# Patient Record
Sex: Female | Born: 1995 | Race: White | Hispanic: No | Marital: Single | Smoking: Never smoker
Health system: Southern US, Community
[De-identification: ages and names within clinical notes are randomized; demographics above are authoritative.]

## PROBLEM LIST (undated history)

## (undated) DIAGNOSIS — D649 Anemia, unspecified: Secondary | ICD-10-CM

## (undated) DIAGNOSIS — J189 Pneumonia, unspecified organism: Secondary | ICD-10-CM

## (undated) DIAGNOSIS — M5431 Sciatica, right side: Secondary | ICD-10-CM

## (undated) DIAGNOSIS — K5190 Ulcerative colitis, unspecified, without complications: Secondary | ICD-10-CM

## (undated) DIAGNOSIS — M1990 Unspecified osteoarthritis, unspecified site: Secondary | ICD-10-CM

## (undated) DIAGNOSIS — K5090 Crohn's disease, unspecified, without complications: Secondary | ICD-10-CM

## (undated) DIAGNOSIS — F411 Generalized anxiety disorder: Secondary | ICD-10-CM

## (undated) DIAGNOSIS — F329 Major depressive disorder, single episode, unspecified: Secondary | ICD-10-CM

## (undated) HISTORY — PX: COLONOSCOPY: SHX174

## (undated) HISTORY — PX: ESOPHAGOGASTRODUODENOSCOPY: SHX1529

## (undated) HISTORY — PX: TONSILLECTOMY: SUR1361

---
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
## 2018-12-25 ENCOUNTER — Encounter: Payer: Self-pay | Admitting: Physician Assistant

## 2018-12-25 ENCOUNTER — Ambulatory Visit (INDEPENDENT_AMBULATORY_CARE_PROVIDER_SITE_OTHER): Payer: 59 | Admitting: Physician Assistant

## 2018-12-25 ENCOUNTER — Ambulatory Visit (INDEPENDENT_AMBULATORY_CARE_PROVIDER_SITE_OTHER): Payer: 59

## 2018-12-25 DIAGNOSIS — M25552 Pain in left hip: Secondary | ICD-10-CM

## 2018-12-25 DIAGNOSIS — M1611 Unilateral primary osteoarthritis, right hip: Secondary | ICD-10-CM

## 2018-12-25 DIAGNOSIS — M87052 Idiopathic aseptic necrosis of left femur: Secondary | ICD-10-CM | POA: Diagnosis not present

## 2018-12-25 NOTE — Progress Notes (Signed)
Office Visit Note   Patient: Daisy Ortiz           Date of Birth: Jun 28, 1995           MRN: [id number] Visit Date: 12/25/2018              Requested by: Nyoka Cowden, MD [address] [place],  [place] [address] PCP: Nyoka Cowden, MD   Assessment & Plan: Visit Diagnoses:  1. Pain in left hip   2. Primary osteoarthritis of right hip   3. Avascular necrosis of bone of hip, left (HCC)     Plan: After reviewing the plain films with patient and her mother and the fact that it is greatly affecting her daily activities recommend total hip replacement.  However due to her history of anemia would not recommend bilateral hip replacements once.  Therefore we will start with the most painful hip which is her right hip.  Again she is failed conservative treatment which is included modification of activities and multiple steroid injections in both hips.  Risk benefits reviewed with the patient at length these include but are not limited to DVT/PE, wound healing problems, infection, blood loss, prolonged pain nerve or vessel injury.  See patient back 2 weeks postop.  Questions encouraged and answered at length by Dr. Magnus Ivan and myself.  Handout on total hip replacement was given hip arthroplasty components shown the patient and her mother today.  Follow-Up Instructions: Return for 2 weeks post-op.   Orders:  Orders Placed This Encounter  Procedures  . XR HIPS BILAT W OR W/O PELVIS 2V   No orders of the defined types were placed in this encounter.     Procedures: No procedures performed   Clinical Data: No additional findings.   Subjective: Chief Complaint  Patient presents with  . Left Hip - Pain  . Right Hip - Pain    HPI Daisy Ortiz is a 23 year old female who comes in today with bilateral hip pain.  States she has had hip pain for years.  She been on steroids multiple times due to her Crohn's disease.  She is seeing a another orthopedic office here in town and they  told her she needed bilateral hip replacements.  She is having difficulty going up and down stairs and walking.  She states she has pain with sitting.  She has problems donning shoes and socks and stopped wearing socks due to the fact that she cannot put a sock on her right foot easily.  States she even has trouble getting dressed on her own due to bilateral hip pain.  She is had multiple intra-articular injections in both hips they are getting progressively less effective.  Review of Systems Negative for fevers or chills.  Objective: Vital Signs: There were no vitals taken for this visit.  Physical Exam Constitutional:      Appearance: She is normal weight. She is not ill-appearing or diaphoretic.  Cardiovascular:     Pulses: Normal pulses.  Pulmonary:     Effort: Pulmonary effort is normal.  Neurological:     Mental Status: She is alert and oriented to person, place, and time.  Psychiatric:        Mood and Affect: Mood normal.     Ortho Exam Bilateral hips she has slightly limited rotation of the left hip with minimal pain.  No internal or external rotation of the right hip pain with any attempts..  Calves are supple nontender bilaterally.  Ambulates  without an assistive device with an antalgic gait  Specialty Comments:  No specialty comments available.  Imaging: Xr Hips Bilat W Or W/o Pelvis 2v  Result Date: 12/25/2018 AP pelvis and bilateral hips: End-stage arthritis right hip.  Osteophytes about the left femoral head with changes consistent with avascular necrosis.  Also narrowing of the left hip joint.  Both hips are well located.  No acute fractures.    PMFS History: There are no active problems to display for this patient.  Past Medical History:  Diagnosis Date  . Crohn's disease ([place])   . Ulcerative colitis ([place])     History reviewed. No pertinent family history.  History reviewed. No pertinent surgical history. Social History   Occupational History  . Not on  file  Tobacco Use  . Smoking status: Never Smoker  Substance and Sexual Activity  . Alcohol use: Not on file  . Drug use: Not on file  . Sexual activity: Not on file

## 2019-01-15 ENCOUNTER — Other Ambulatory Visit: Payer: Self-pay | Admitting: Physician Assistant

## 2019-01-22 ENCOUNTER — Other Ambulatory Visit: Payer: Self-pay

## 2019-01-26 NOTE — Patient Instructions (Addendum)
DUE TO COVID-19 ONLY ONE VISITOR IS ALLOWED TO COME WITH YOU AND STAY IN THE WAITING ROOM ONLY DURING PRE OP AND PROCEDURE DAY OF SURGERY. THE 1 VISITOR MAY VISIT WITH YOU AFTER SURGERY IN YOUR PRIVATE ROOM DURING VISITING HOURS ONLY!  YOU NEED TO HAVE A COVID 19 TEST ON_______ @_______ , THIS TEST MUST BE DONE BEFORE SURGERY, COME  [address], [place] , [phone].  [facility]) ONCE YOUR COVID TEST IS COMPLETED, PLEASE BEGIN THE QUARANTINE INSTRUCTIONS AS OUTLINED IN YOUR HANDOUT.                Daisy Ortiz  01/26/2019   Your procedure is scheduled on: 02-02-19   [facility]   Report to admitting at        0945  AM     Call this number if you have problems the morning of surgery [phone]    Remember: NO SOLID FOOD AFTER MIDNIGHT THE NIGHT PRIOR TO SURGERY. NOTHING BY MOUTH EXCEPT CLEAR LIQUIDS UNTIL 0915 am . PLEASE FINISH ENSURE DRINK PER SURGEON ORDER  WHICH NEEDS TO BE COMPLETED AT      0915 am then nothing by mouth .    CLEAR LIQUID DIET   Foods Allowed                                                                     Foods Excluded  Coffee and tea, regular and decaf                             liquids that you cannot  Plain Jell-O any favor except red or purple                                           see through such as: Fruit ices (not with fruit pulp)                                     milk, soups, orange juice  Iced Popsicles                                    All solid food Carbonated beverages, regular and diet                                    Cranberry, grape and apple juices Sports drinks like Gatorade Lightly seasoned clear broth or consume(fat free) Sugar, honey syrup   _____________________________________________________________________     BRUSH YOUR TEETH MORNING OF SURGERY AND RINSE YOUR MOUTH OUT, NO CHEWING GUM CANDY OR MINTS.     Take these medicines the morning of surgery with A  SIP OF WATER:  Valium if needed  You may not have any metal on your body including hair pins and              piercings  Do not wear jewelry, make-up, lotions, powders or perfumes, deodorant             Do not wear nail polish.  Do not shave  48 hours prior to surgery.              Do not bring valuables to the hospital. [place].  Contacts, dentures or bridgework may not be worn into surgery.               Please read over the following fact sheets you were given: ____________________________________________________________________           [facility] - Preparing for Surgery Before surgery, you can play an important role.  Because skin is not sterile, your skin needs to be as free of germs as possible.  You can reduce the number of germs on your skin by washing with CHG (chlorahexidine gluconate) soap before surgery.  CHG is an antiseptic cleaner which kills germs and bonds with the skin to continue killing germs even after washing. Please DO NOT use if you have an allergy to CHG or antibacterial soaps.  If your skin becomes reddened/irritated stop using the CHG and inform your nurse when you arrive at Short Stay. Do not shave (including legs and underarms) for at least 48 hours prior to the first CHG shower.  You may shave your face/neck. Please follow these instructions carefully:  1.  Shower with CHG Soap the night before surgery and the  morning of Surgery.  2.  If you choose to wash your hair, wash your hair first as usual with your  normal  shampoo.  3.  After you shampoo, rinse your hair and body thoroughly to remove the  shampoo.                           4.  Use CHG as you would any other liquid soap.  You can apply chg directly  to the skin and wash                       Gently with a scrungie or clean washcloth.  5.  Apply the CHG Soap to your body ONLY FROM THE NECK DOWN.   Do not use on face/  open                           Wound or open sores. Avoid contact with eyes, ears mouth and genitals (private parts).                       Wash face,  Genitals (private parts) with your normal soap.             6.  Wash thoroughly, paying special attention to the area where your surgery  will be performed.  7.  Thoroughly rinse your body with warm water from the neck down.  8.  DO NOT shower/wash with your normal soap after using and rinsing off  the CHG Soap.                9.  Pat yourself dry with  a clean towel.            10.  Wear clean pajamas.            11.  Place clean sheets on your bed the night of your first shower and do not  sleep with pets. Day of Surgery : Do not apply any lotions/deodorants the morning of surgery.  Please wear clean clothes to the hospital/surgery center.  FAILURE TO FOLLOW THESE INSTRUCTIONS MAY RESULT IN THE CANCELLATION OF YOUR SURGERY PATIENT SIGNATURE_________________________________  NURSE SIGNATURE__________________________________  ________________________________________________________________________   Daisy Ortiz  An incentive spirometer is a tool that can help keep your lungs clear and active. This tool measures how well you are filling your lungs with each breath. Taking long deep breaths may help reverse or decrease the chance of developing breathing (pulmonary) problems (especially infection) following:  A long period of time when you are unable to move or be active. BEFORE THE PROCEDURE   If the spirometer includes an indicator to show your best effort, your nurse or respiratory therapist will set it to a desired goal.  If possible, sit up straight or lean slightly forward. Try not to slouch.  Hold the incentive spirometer in an upright position. INSTRUCTIONS FOR USE  1. Sit on the edge of your bed if possible, or sit up as far as you can in bed or on a chair. 2. Hold the incentive spirometer in an upright  position. 3. Breathe out normally. 4. Place the mouthpiece in your mouth and seal your lips tightly around it. 5. Breathe in slowly and as deeply as possible, raising the piston or the ball toward the top of the column. 6. Hold your breath for 3-5 seconds or for as long as possible. Allow the piston or ball to fall to the bottom of the column. 7. Remove the mouthpiece from your mouth and breathe out normally. 8. Rest for a few seconds and repeat Steps 1 through 7 at least 10 times every 1-2 hours when you are awake. Take your time and take a few normal breaths between deep breaths. 9. The spirometer may include an indicator to show your best effort. Use the indicator as a goal to work toward during each repetition. 10. After each set of 10 deep breaths, practice coughing to be sure your lungs are clear. If you have an incision (the cut made at the time of surgery), support your incision when coughing by placing a pillow or rolled up towels firmly against it. Once you are able to get out of bed, walk around indoors and cough well. You may stop using the incentive spirometer when instructed by your caregiver.  RISKS AND COMPLICATIONS  Take your time so you do not get dizzy or light-headed.  If you are in pain, you may need to take or ask for pain medication before doing incentive spirometry. It is harder to take a deep breath if you are having pain. AFTER USE  Rest and breathe slowly and easily.  It can be helpful to keep track of a log of your progress. Your caregiver can provide you with a simple table to help with this. If you are using the spirometer at home, follow these instructions: SEEK MEDICAL CARE IF:   You are having difficultly using the spirometer.  You have trouble using the spirometer as often as instructed.  Your pain medication is not giving enough relief while using the spirometer.  You develop fever of 100.5 F (38.1 C)  or higher. SEEK IMMEDIATE MEDICAL CARE IF:    You cough up bloody sputum that had not been present before.  You develop fever of 102 F (38.9 C) or greater.  You develop worsening pain at or near the incision site. MAKE SURE YOU:   Understand these instructions.  Will watch your condition.  Will get help right away if you are not doing well or get worse. Document Released: 09/27/2006 Document Revised: 08/09/2011 Document Reviewed: 11/28/2006 [facility] Patient Information 2014 [employer].   ________________________________________________________________________

## 2019-01-30 ENCOUNTER — Encounter (HOSPITAL_COMMUNITY): Payer: Self-pay

## 2019-01-30 ENCOUNTER — Other Ambulatory Visit: Payer: Self-pay

## 2019-01-30 ENCOUNTER — Encounter (HOSPITAL_COMMUNITY)
Admission: RE | Admit: 2019-01-30 | Discharge: 2019-01-30 | Disposition: A | Discharge status: Home / Self Care | Payer: 59 | Source: Ambulatory Visit | Attending: Orthopaedic Surgery | Admitting: Orthopaedic Surgery

## 2019-01-30 ENCOUNTER — Other Ambulatory Visit (HOSPITAL_COMMUNITY)
Admission: RE | Admit: 2019-01-30 | Discharge: 2019-01-30 | Disposition: A | Discharge status: Home / Self Care | Payer: 59 | Source: Ambulatory Visit | Attending: Orthopaedic Surgery | Admitting: Orthopaedic Surgery

## 2019-01-30 HISTORY — DX: Anemia, unspecified: D64.9

## 2019-01-30 HISTORY — DX: Unspecified osteoarthritis, unspecified site: M19.90

## 2019-01-30 HISTORY — DX: Pneumonia, unspecified organism: J18.9

## 2019-01-30 LAB — CBC
HCT: 39 % (ref 36.0–46.0)
Hemoglobin: 12.2 g/dL (ref 12.0–15.0)
MCH: 26.6 pg (ref 26.0–34.0)
MCHC: 31.3 g/dL (ref 30.0–36.0)
MCV: 85.2 fL (ref 80.0–100.0)
Platelets: 292 10*3/uL (ref 150–400)
RBC: 4.58 MIL/uL (ref 3.87–5.11)
RDW: 14.2 % (ref 11.5–15.5)
WBC: 9.8 10*3/uL (ref 4.0–10.5)
nRBC: 0 % (ref 0.0–0.2)

## 2019-01-30 LAB — SARS CORONAVIRUS 2 (TAT 6-24 HRS): SARS Coronavirus 2: NEGATIVE

## 2019-01-30 NOTE — Progress Notes (Signed)
PCP - Helene Kelp Cardiologist - none  Chest x-ray -  EKG -  Stress Test -  ECHO -  Cardiac Cath -   Sleep Study -  CPAP -   Fasting Blood Sugar -  Checks Blood Sugar _____ times a day  Blood Thinner Instructions: Aspirin Instructions:  Anesthesia review:   Patient denies shortness of breath, fever, cough and chest pain at PAT appointment       Patient verbalized understanding of instructions that were given to them at the PAT appointment. Patient was also instructed that they will need to review over the PAT instructions again at home before surgery.

## 2019-02-01 LAB — NASOPHARYNGEAL CULTURE: Culture: NORMAL

## 2019-02-02 ENCOUNTER — Inpatient Hospital Stay (HOSPITAL_COMMUNITY): Payer: 59

## 2019-02-02 ENCOUNTER — Inpatient Hospital Stay (HOSPITAL_COMMUNITY): Payer: 59 | Admitting: Physician Assistant

## 2019-02-02 ENCOUNTER — Encounter (HOSPITAL_COMMUNITY)
Admission: RE | Disposition: A | Discharge status: Home Health | Payer: Self-pay | Source: Home / Self Care | Attending: Orthopaedic Surgery

## 2019-02-02 ENCOUNTER — Inpatient Hospital Stay (HOSPITAL_COMMUNITY)
Admission: RE | Admit: 2019-02-02 | Discharge: 2019-02-04 | DRG: 470 | Disposition: A | Discharge status: Home Health | Payer: 59 | Attending: Orthopaedic Surgery | Admitting: Orthopaedic Surgery

## 2019-02-02 ENCOUNTER — Encounter (HOSPITAL_COMMUNITY): Payer: Self-pay | Admitting: *Deleted

## 2019-02-02 ENCOUNTER — Other Ambulatory Visit: Payer: Self-pay

## 2019-02-02 ENCOUNTER — Inpatient Hospital Stay (HOSPITAL_COMMUNITY): Payer: 59 | Admitting: Certified Registered Nurse Anesthetist

## 2019-02-02 DIAGNOSIS — M879 Osteonecrosis, unspecified: Secondary | ICD-10-CM | POA: Diagnosis present

## 2019-02-02 DIAGNOSIS — Z419 Encounter for procedure for purposes other than remedying health state, unspecified: Secondary | ICD-10-CM

## 2019-02-02 DIAGNOSIS — M1611 Unilateral primary osteoarthritis, right hip: Principal | ICD-10-CM

## 2019-02-02 DIAGNOSIS — Z20828 Contact with and (suspected) exposure to other viral communicable diseases: Secondary | ICD-10-CM | POA: Diagnosis present

## 2019-02-02 DIAGNOSIS — Z888 Allergy status to other drugs, medicaments and biological substances status: Secondary | ICD-10-CM

## 2019-02-02 DIAGNOSIS — Z96641 Presence of right artificial hip joint: Secondary | ICD-10-CM

## 2019-02-02 DIAGNOSIS — Z79899 Other long term (current) drug therapy: Secondary | ICD-10-CM

## 2019-02-02 HISTORY — PX: TOTAL HIP ARTHROPLASTY: SHX124

## 2019-02-02 LAB — HCG, SERUM, QUALITATIVE: Preg, Serum: NEGATIVE

## 2019-02-02 SURGERY — ARTHROPLASTY, HIP, TOTAL, ANTERIOR APPROACH
Anesthesia: Spinal | Laterality: Right

## 2019-02-02 MED ORDER — METHOCARBAMOL 500 MG PO TABS
500.0000 mg | ORAL_TABLET | Freq: Four times a day (QID) | ORAL | Status: DC | PRN
  Administered 2019-02-02 – 2019-02-04 (×5): 500 mg via ORAL
  Filled 2019-02-02 (×5): qty 1

## 2019-02-02 MED ORDER — METHOCARBAMOL 500 MG IVPB - SIMPLE MED
500.0000 mg | Freq: Four times a day (QID) | INTRAVENOUS | Status: DC | PRN
  Administered 2019-02-02: 500 mg via INTRAVENOUS
  Filled 2019-02-02: qty 50

## 2019-02-02 MED ORDER — ONDANSETRON HCL 4 MG PO TABS: 4.0000 mg | ORAL_TABLET | Freq: Four times a day (QID) | ORAL | Status: DC | PRN

## 2019-02-02 MED ORDER — MENTHOL 3 MG MT LOZG: 1.0000 | LOZENGE | OROMUCOSAL | Status: DC | PRN

## 2019-02-02 MED ORDER — POLYETHYLENE GLYCOL 3350 17 G PO PACK: 17.0000 g | PACK | Freq: Every day | ORAL | Status: DC | PRN

## 2019-02-02 MED ORDER — ONDANSETRON HCL 4 MG/2ML IJ SOLN
INTRAMUSCULAR | Status: DC | PRN
  Administered 2019-02-02: 4 mg via INTRAVENOUS

## 2019-02-02 MED ORDER — ASPIRIN 81 MG PO CHEW
81.0000 mg | CHEWABLE_TABLET | Freq: Two times a day (BID) | ORAL | Status: DC
  Administered 2019-02-02 – 2019-02-04 (×4): 81 mg via ORAL
  Filled 2019-02-02 (×4): qty 1

## 2019-02-02 MED ORDER — ONDANSETRON HCL 4 MG/2ML IJ SOLN: 4.0000 mg | Freq: Four times a day (QID) | INTRAMUSCULAR | Status: DC | PRN

## 2019-02-02 MED ORDER — ONDANSETRON HCL 4 MG/2ML IJ SOLN
INTRAMUSCULAR | Status: AC
  Filled 2019-02-02: qty 2

## 2019-02-02 MED ORDER — FERROUS SULFATE 325 (65 FE) MG PO TABS
ORAL_TABLET | Freq: Every day | ORAL | Status: DC
  Administered 2019-02-02 – 2019-02-04 (×3): 325 mg via ORAL
  Filled 2019-02-02 (×3): qty 1

## 2019-02-02 MED ORDER — MIDAZOLAM HCL 2 MG/2ML IJ SOLN
INTRAMUSCULAR | Status: AC
  Filled 2019-02-02: qty 2

## 2019-02-02 MED ORDER — PANTOPRAZOLE SODIUM 40 MG PO TBEC
40.0000 mg | DELAYED_RELEASE_TABLET | Freq: Every day | ORAL | Status: DC
  Administered 2019-02-03 – 2019-02-04 (×2): 40 mg via ORAL
  Filled 2019-02-02 (×3): qty 1

## 2019-02-02 MED ORDER — KETOROLAC TROMETHAMINE 15 MG/ML IJ SOLN
7.5000 mg | Freq: Four times a day (QID) | INTRAMUSCULAR | Status: DC
  Administered 2019-02-02 – 2019-02-04 (×7): 7.5 mg via INTRAVENOUS
  Filled 2019-02-02 (×8): qty 1

## 2019-02-02 MED ORDER — ESCITALOPRAM OXALATE 10 MG PO TABS
10.0000 mg | ORAL_TABLET | Freq: Every evening | ORAL | Status: DC
  Administered 2019-02-02 – 2019-02-03 (×2): 10 mg via ORAL
  Filled 2019-02-02 (×2): qty 1

## 2019-02-02 MED ORDER — METHOCARBAMOL 500 MG IVPB - SIMPLE MED
INTRAVENOUS | Status: AC
  Filled 2019-02-02: qty 50

## 2019-02-02 MED ORDER — ACETAMINOPHEN 325 MG PO TABS
325.0000 mg | ORAL_TABLET | Freq: Four times a day (QID) | ORAL | Status: DC | PRN
  Administered 2019-02-04: 650 mg via ORAL
  Filled 2019-02-02: qty 2

## 2019-02-02 MED ORDER — CEFAZOLIN SODIUM-DEXTROSE 1-4 GM/50ML-% IV SOLN
1.0000 g | Freq: Four times a day (QID) | INTRAVENOUS | Status: AC
  Administered 2019-02-02 – 2019-02-03 (×2): 1 g via INTRAVENOUS
  Filled 2019-02-02 (×2): qty 50

## 2019-02-02 MED ORDER — PROPOFOL 10 MG/ML IV BOLUS
INTRAVENOUS | Status: DC | PRN
  Administered 2019-02-02: 30 mg via INTRAVENOUS
  Administered 2019-02-02 (×3): 20 mg via INTRAVENOUS
  Administered 2019-02-02: 10 mg via INTRAVENOUS

## 2019-02-02 MED ORDER — PHENOL 1.4 % MT LIQD: 1.0000 | OROMUCOSAL | Status: DC | PRN

## 2019-02-02 MED ORDER — METOCLOPRAMIDE HCL 5 MG/ML IJ SOLN: 5.0000 mg | Freq: Three times a day (TID) | INTRAMUSCULAR | Status: DC | PRN

## 2019-02-02 MED ORDER — LACTATED RINGERS IV SOLN: INTRAVENOUS | Status: DC

## 2019-02-02 MED ORDER — SODIUM CHLORIDE 0.9 % IV SOLN
INTRAVENOUS | Status: DC
  Administered 2019-02-02: 23:00:00 via INTRAVENOUS

## 2019-02-02 MED ORDER — FENTANYL CITRATE (PF) 100 MCG/2ML IJ SOLN
25.0000 ug | INTRAMUSCULAR | Status: DC | PRN
  Administered 2019-02-02 (×3): 50 ug via INTRAVENOUS

## 2019-02-02 MED ORDER — DIPHENHYDRAMINE HCL 12.5 MG/5ML PO ELIX: 12.5000 mg | ORAL_SOLUTION | ORAL | Status: DC | PRN

## 2019-02-02 MED ORDER — EPHEDRINE SULFATE-NACL 50-0.9 MG/10ML-% IV SOSY
PREFILLED_SYRINGE | INTRAVENOUS | Status: DC | PRN
  Administered 2019-02-02: 5 mg via INTRAVENOUS
  Administered 2019-02-02: 10 mg via INTRAVENOUS

## 2019-02-02 MED ORDER — ALUM & MAG HYDROXIDE-SIMETH 200-200-20 MG/5ML PO SUSP: 30.0000 mL | ORAL | Status: DC | PRN

## 2019-02-02 MED ORDER — HYDROMORPHONE HCL 1 MG/ML IJ SOLN
INTRAMUSCULAR | Status: AC
  Filled 2019-02-02: qty 1

## 2019-02-02 MED ORDER — MIDAZOLAM HCL 5 MG/5ML IJ SOLN
INTRAMUSCULAR | Status: DC | PRN
  Administered 2019-02-02: 2 mg via INTRAVENOUS

## 2019-02-02 MED ORDER — PROPOFOL 10 MG/ML IV BOLUS
INTRAVENOUS | Status: AC
  Filled 2019-02-02: qty 60

## 2019-02-02 MED ORDER — FENTANYL CITRATE (PF) 100 MCG/2ML IJ SOLN
INTRAMUSCULAR | Status: DC | PRN
  Administered 2019-02-02 (×2): 50 ug via INTRAVENOUS

## 2019-02-02 MED ORDER — OXYCODONE HCL 5 MG PO TABS
ORAL_TABLET | ORAL | Status: AC
  Filled 2019-02-02: qty 1

## 2019-02-02 MED ORDER — CHLORHEXIDINE GLUCONATE 4 % EX LIQD: 60.0000 mL | Freq: Once | CUTANEOUS | Status: DC

## 2019-02-02 MED ORDER — LACTATED RINGERS IV SOLN
INTRAVENOUS | Status: DC
  Administered 2019-02-02 (×3): via INTRAVENOUS

## 2019-02-02 MED ORDER — METOCLOPRAMIDE HCL 5 MG/ML IJ SOLN: 10.0000 mg | Freq: Once | INTRAMUSCULAR | Status: DC | PRN

## 2019-02-02 MED ORDER — OXYCODONE HCL 5 MG PO TABS
5.0000 mg | ORAL_TABLET | ORAL | Status: DC | PRN
  Administered 2019-02-02: 5 mg via ORAL
  Administered 2019-02-02: 10 mg via ORAL
  Administered 2019-02-03 (×2): 5 mg via ORAL
  Administered 2019-02-03: 10 mg via ORAL
  Filled 2019-02-02 (×2): qty 1
  Filled 2019-02-02 (×3): qty 2

## 2019-02-02 MED ORDER — TRANEXAMIC ACID-NACL 1000-0.7 MG/100ML-% IV SOLN
1000.0000 mg | INTRAVENOUS | Status: AC
  Administered 2019-02-02: 1000 mg via INTRAVENOUS

## 2019-02-02 MED ORDER — FENTANYL CITRATE (PF) 100 MCG/2ML IJ SOLN
INTRAMUSCULAR | Status: AC
  Filled 2019-02-02: qty 2

## 2019-02-02 MED ORDER — NORETHIN ACE-ETH ESTRAD-FE 1.5-30 MG-MCG PO TABS: 1.0000 | ORAL_TABLET | Freq: Every evening | ORAL | Status: DC

## 2019-02-02 MED ORDER — BUPIVACAINE IN DEXTROSE 0.75-8.25 % IT SOLN
INTRATHECAL | Status: DC | PRN
  Administered 2019-02-02: 1.8 mL via INTRATHECAL

## 2019-02-02 MED ORDER — HYDROMORPHONE HCL 1 MG/ML IJ SOLN
0.5000 mg | INTRAMUSCULAR | Status: DC | PRN
  Administered 2019-02-02: 1 mg via INTRAVENOUS

## 2019-02-02 MED ORDER — TRANEXAMIC ACID-NACL 1000-0.7 MG/100ML-% IV SOLN
INTRAVENOUS | Status: AC
  Filled 2019-02-02: qty 100

## 2019-02-02 MED ORDER — SODIUM CHLORIDE 0.9 % IR SOLN
Status: DC | PRN
  Administered 2019-02-02 (×2): 1000 mL

## 2019-02-02 MED ORDER — METOCLOPRAMIDE HCL 5 MG PO TABS: 5.0000 mg | ORAL_TABLET | Freq: Three times a day (TID) | ORAL | Status: DC | PRN

## 2019-02-02 MED ORDER — CEFAZOLIN SODIUM-DEXTROSE 2-4 GM/100ML-% IV SOLN
INTRAVENOUS | Status: AC
  Filled 2019-02-02: qty 100

## 2019-02-02 MED ORDER — HYDROMORPHONE HCL 1 MG/ML IJ SOLN
0.5000 mg | INTRAMUSCULAR | Status: DC | PRN
  Administered 2019-02-03: 1 mg via INTRAVENOUS
  Filled 2019-02-02: qty 1

## 2019-02-02 MED ORDER — POVIDONE-IODINE 10 % EX SWAB
2.0000 "application " | Freq: Once | CUTANEOUS | Status: AC
  Administered 2019-02-02: 2 via TOPICAL

## 2019-02-02 MED ORDER — CEFAZOLIN SODIUM-DEXTROSE 2-4 GM/100ML-% IV SOLN
2.0000 g | INTRAVENOUS | Status: AC
  Administered 2019-02-02: 2 g via INTRAVENOUS

## 2019-02-02 MED ORDER — PROPOFOL 500 MG/50ML IV EMUL
INTRAVENOUS | Status: DC | PRN
  Administered 2019-02-02: 75 ug/kg/min via INTRAVENOUS

## 2019-02-02 MED ORDER — NORETHIN ACE-ETH ESTRAD-FE 1.5-30 MG-MCG PO TABS
1.0000 | ORAL_TABLET | Freq: Every evening | ORAL | Status: DC
  Filled 2019-02-02: qty 1

## 2019-02-02 MED ORDER — OXYCODONE HCL 5 MG PO TABS
10.0000 mg | ORAL_TABLET | ORAL | Status: DC | PRN
  Administered 2019-02-02 – 2019-02-03 (×2): 10 mg via ORAL
  Administered 2019-02-04: 15 mg via ORAL
  Filled 2019-02-02 (×2): qty 2
  Filled 2019-02-02: qty 3

## 2019-02-02 MED ORDER — DIAZEPAM 2 MG PO TABS
5.0000 mg | ORAL_TABLET | Freq: Three times a day (TID) | ORAL | Status: DC | PRN
  Administered 2019-02-02: 5 mg via ORAL
  Filled 2019-02-02: qty 3

## 2019-02-02 MED ORDER — MEPERIDINE HCL 50 MG/ML IJ SOLN: 6.2500 mg | INTRAMUSCULAR | Status: DC | PRN

## 2019-02-02 MED ORDER — FENTANYL CITRATE (PF) 100 MCG/2ML IJ SOLN
INTRAMUSCULAR | Status: AC
  Filled 2019-02-02: qty 4

## 2019-02-02 MED ORDER — DOCUSATE SODIUM 100 MG PO CAPS
100.0000 mg | ORAL_CAPSULE | Freq: Two times a day (BID) | ORAL | Status: DC
  Administered 2019-02-03 – 2019-02-04 (×3): 100 mg via ORAL
  Filled 2019-02-02 (×4): qty 1

## 2019-02-02 SURGICAL SUPPLY — 44 items
BAG ZIPLOCK 12X15 (MISCELLANEOUS) IMPLANT
BALL HIP CERAMIC (Hips) IMPLANT
BENZOIN TINCTURE PRP APPL 2/3 (GAUZE/BANDAGES/DRESSINGS) ×2 IMPLANT
BLADE SAW SGTL 18X1.27X75 (BLADE) ×2 IMPLANT
BLADE SAW SGTL 18X1.27X75MM (BLADE) ×1
BLADE SURG SZ10 CARB STEEL (BLADE) ×6 IMPLANT
CLOSURE WOUND 1/2 X4 (GAUZE/BANDAGES/DRESSINGS) ×1
COVER PERINEAL POST (MISCELLANEOUS) ×3 IMPLANT
COVER SURGICAL LIGHT HANDLE (MISCELLANEOUS) ×3 IMPLANT
COVER WAND RF STERILE (DRAPES) IMPLANT
CUP ACET PINNACLE SECTR 48MM (Joint) IMPLANT
DRAPE STERI IOBAN 125X83 (DRAPES) ×3 IMPLANT
DRAPE U-SHAPE 47X51 STRL (DRAPES) ×6 IMPLANT
DRESSING AQUACEL AG SP 3.5X10 (GAUZE/BANDAGES/DRESSINGS) IMPLANT
DRSG AQUACEL AG ADV 3.5X10 (GAUZE/BANDAGES/DRESSINGS) ×3 IMPLANT
DRSG AQUACEL AG SP 3.5X10 (GAUZE/BANDAGES/DRESSINGS) ×3
DURAPREP 26ML APPLICATOR (WOUND CARE) ×3 IMPLANT
ELECT REM PT RETURN 15FT ADLT (MISCELLANEOUS) ×3 IMPLANT
GAUZE XEROFORM 1X8 LF (GAUZE/BANDAGES/DRESSINGS) ×3 IMPLANT
GLOVE BIO SURGEON STRL SZ7.5 (GLOVE) ×3 IMPLANT
GLOVE BIOGEL PI IND STRL 8 (GLOVE) ×2 IMPLANT
GLOVE BIOGEL PI INDICATOR 8 (GLOVE) ×4
GLOVE ECLIPSE 8.0 STRL XLNG CF (GLOVE) ×3 IMPLANT
GOWN STRL REUS W/TWL XL LVL3 (GOWN DISPOSABLE) ×6 IMPLANT
HANDPIECE INTERPULSE COAX TIP (DISPOSABLE) ×2
HIP BALL CERAMIC (Hips) ×3 IMPLANT
HOLDER FOLEY CATH W/STRAP (MISCELLANEOUS) ×3 IMPLANT
KIT TURNOVER KIT A (KITS) IMPLANT
PACK ANTERIOR HIP CUSTOM (KITS) ×3 IMPLANT
PINN ALTRX NEUT ID X OD 32X48 ×2 IMPLANT
PINNSECTOR W/GRIP ACE CUP 48MM (Joint) ×3 IMPLANT
SET HNDPC FAN SPRY TIP SCT (DISPOSABLE) ×1 IMPLANT
STAPLER VISISTAT 35W (STAPLE) IMPLANT
STEM FEM SZ3 STD ACTIS (Stem) ×2 IMPLANT
STRIP CLOSURE SKIN 1/2X4 (GAUZE/BANDAGES/DRESSINGS) ×1 IMPLANT
SUT ETHIBOND NAB CT1 #1 30IN (SUTURE) ×3 IMPLANT
SUT ETHILON 2 0 PS N (SUTURE) IMPLANT
SUT MNCRL AB 4-0 PS2 18 (SUTURE) IMPLANT
SUT VIC AB 0 CT1 36 (SUTURE) ×3 IMPLANT
SUT VIC AB 1 CT1 36 (SUTURE) ×3 IMPLANT
SUT VIC AB 2-0 CT1 27 (SUTURE) ×4
SUT VIC AB 2-0 CT1 TAPERPNT 27 (SUTURE) ×2 IMPLANT
TRAY FOLEY MTR SLVR 16FR STAT (SET/KITS/TRAYS/PACK) ×3 IMPLANT
YANKAUER SUCT BULB TIP 10FT TU (MISCELLANEOUS) ×3 IMPLANT

## 2019-02-02 NOTE — Anesthesia Preprocedure Evaluation (Signed)
Anesthesia Evaluation  Patient identified by MRN, date of birth, ID band Patient awake    Reviewed: Allergy & Precautions, NPO status , Patient's Chart, lab work & pertinent test results  Airway Mallampati: II  TM Distance: >3 FB Neck ROM: Full    Dental no notable dental hx.    Pulmonary neg pulmonary ROS,    Pulmonary exam normal breath sounds clear to auscultation       Cardiovascular negative cardio ROS Normal cardiovascular exam Rhythm:Regular Rate:Normal     Neuro/Psych negative neurological ROS  negative psych ROS   GI/Hepatic negative GI ROS, Neg liver ROS,   Endo/Other  negative endocrine ROS  Renal/GU negative Renal ROS  negative genitourinary   Musculoskeletal negative musculoskeletal ROS (+)   Abdominal   Peds negative pediatric ROS (+)  Hematology negative hematology ROS (+)   Anesthesia Other Findings   Reproductive/Obstetrics negative OB ROS                             Anesthesia Physical Anesthesia Plan  ASA: I  Anesthesia Plan: Spinal   Post-op Pain Management:    Induction:   PONV Risk Score and Plan: 2 and Treatment may vary due to age or medical condition  Airway Management Planned: Nasal Cannula and Simple Face Mask  Additional Equipment:   Intra-op Plan:   Post-operative Plan:   Informed Consent: I have reviewed the patients History and Physical, chart, labs and discussed the procedure including the risks, benefits and alternatives for the proposed anesthesia with the patient or authorized representative who has indicated his/her understanding and acceptance.     Dental advisory given  Plan Discussed with: CRNA  Anesthesia Plan Comments:         Anesthesia Quick Evaluation

## 2019-02-02 NOTE — H&P (Signed)
TOTAL HIP ADMISSION H&P  Patient is admitted for right total hip arthroplasty.  Subjective:  Chief Complaint: right hip pain  HPI: Daisy Ortiz, 23 y.o. female, has a history of pain and functional disability in the right hip(s) due to arthritis and patient has failed non-surgical conservative treatments for greater than 12 weeks to include NSAID's and/or analgesics, corticosteriod injections, flexibility and strengthening excercises, supervised PT with diminished ADL's post treatment, use of assistive devices and activity modification.  Onset of symptoms was gradual starting 2 years ago with gradually worsening course since that time.The patient noted no past surgery on the right hip(s).  Patient currently rates pain in the right hip at 10 out of 10 with activity. Patient has night pain, worsening of pain with activity and weight bearing, trendelenberg gait, pain that interfers with activities of daily living, pain with passive range of motion and crepitus. Patient has evidence of subchondral cysts, subchondral sclerosis, periarticular osteophytes and joint space narrowing by imaging studies. This condition presents safety issues increasing the risk of falls.  There is no current active infection.  Patient Active Problem List   Diagnosis Date Noted  . Unilateral primary osteoarthritis, right hip 02/02/2019   Past Medical History:  Diagnosis Date  . Anemia   . 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 Crohn's disease (HCC)   . Pneumonia    as a child  . Ulcerative colitis [facility])     Past Surgical History:  Procedure Laterality Date  . COLONOSCOPY    . ESOPHAGOGASTRODUODENOSCOPY    . TONSILLECTOMY      No current facility-administered medications for this encounter.    Current Outpatient Medications  Medication Sig Dispense Refill Last Dose  . acetaminophen (TYLENOL) 500 MG tablet Take 500 mg by mouth 3 (three) times daily as needed for moderate pain or headache.     . diazepam (VALIUM) 5 MG  tablet Take 5 mg by mouth every 8 (eight) hours as needed for muscle spasms or anxiety.     . ENTYVIO 300 MG injection Inject 300 mg as directed every 14 (fourteen) days.     Marland Kitchen escitalopram (LEXAPRO) 10 MG tablet Take 10 mg by mouth every evening.     . Ferrous Sulfate (IRON PO) Take 1 tablet by mouth daily.     Colleen Can FE 1.5/30 1.5-30 MG-MCG tablet Take 1 tablet by mouth every evening.      Allergies  Allergen Reactions  . Infliximab Anaphylaxis, Shortness Of Breath, Nausea And Vomiting and Swelling    Facial Edema (intolerance)    Social History   Tobacco Use  . Smoking status: Never Smoker  . Smokeless tobacco: Never Used  Substance Use Topics  . Alcohol use: Never    Frequency: Never    No family history on file.   Review of Systems  Musculoskeletal: Positive for joint pain.  All other systems reviewed and are negative.   Objective:  Physical Exam  Constitutional: She is oriented to person, place, and time. She appears well-developed and well-nourished.  HENT:  Head: Normocephalic and atraumatic.  Eyes: Pupils are equal, round, and reactive to light. EOM are normal.  Neck: Normal range of motion. Neck supple.  Cardiovascular: Normal rate.  Respiratory: Effort normal.  GI: Soft.  Musculoskeletal:     Right hip: She exhibits decreased range of motion, decreased strength, tenderness and bony tenderness.  Neurological: She is alert and oriented to person, place, and time.  Skin: Skin is warm and  dry.  Psychiatric: She has a normal mood and affect.    Vital signs in last 24 hours:    Labs:   Estimated body mass index is 19.73 kg/m as calculated from the following:   Height as of 01/30/19: 5' 5.5" (1.664 m).   Weight as of 01/30/19: 54.6 kg.   Imaging Review Plain radiographs demonstrate severe degenerative joint disease of the right hip(s). The bone quality appears to be excellent for age and reported activity level.      Assessment/Plan:  End stage  arthritis, right hip(s)  The patient history, physical examination, clinical judgement of the provider and imaging studies are consistent with end stage degenerative joint disease of the right hip(s) and total hip arthroplasty is deemed medically necessary. The treatment options including medical management, injection therapy, arthroscopy and arthroplasty were discussed at length. The risks and benefits of total hip arthroplasty were presented and reviewed. The risks due to aseptic loosening, infection, stiffness, dislocation/subluxation,  thromboembolic complications and other imponderables were discussed.  The patient acknowledged the explanation, agreed to proceed with the plan and consent was signed. Patient is being admitted for inpatient treatment for surgery, pain control, PT, OT, prophylactic antibiotics, VTE prophylaxis, progressive ambulation and ADL's and discharge planning.The patient is planning to be discharged home with home health services

## 2019-02-02 NOTE — Op Note (Signed)
NAME: Daisy Ortiz, Daisy E. MEDICAL RECORD [id number] ACCOUNT [id number] DATE OF BIRTH:July 30, 1995 FACILITY: [facility] LOCATION: [facility] PHYSICIAN:Tan Clopper Aretha ParrotY. Yosef Krogh, MD  OPERATIVE REPORT  DATE OF PROCEDURE:  02/02/2019  PREOPERATIVE DIAGNOSIS:  Avascular necrosis, right hip.  POSTOPERATIVE DIAGNOSIS:  Avascular necrosis, right hip.  PROCEDURE:  Right total hip arthroplasty through direct anterior approach.  IMPLANTS:  DePuy Sector Gription acetabular component size 48, size 32+4 neutral polyethylene liner, size 3 Actis femoral component with standard offset, size 32+5 ceramic hip ball.  SURGEON:  Vanita PandaChristopher Y. Magnus IvanBlackman, MD  ASSISTANT:  Richardean CanalGilbert Clark, PA-C  ANESTHESIA:  Spinal.  ANTIBIOTICS:  Two grams IV Ancef.  ESTIMATED BLOOD LOSS:  350 mL  COMPLICATIONS:  None.  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  She does have Crohn's disease and has had steroids interventions multiple times over the years.  This has likely contributed to the  avascular necrosis of both her hips.  She has significant problems with her gait.  Her pain is daily.  It is mainly with weightbearing, but now has become detrimental to her activities of daily living, her quality of life and her mobility.  At this  point, we have recommended total hip arthroplasty since this is end-stage avascular necrosis of both hips.  She wants to proceed with the right hip first because that has been hurting her the most.  Even in the office we were concerned about just her  gait in general.  We did talk about the risk of acute blood loss anemia, nerve or vessel injury, fracture, infection, DVT, dislocation and implant failure.  We talked about the goals of decreased pain, improve mobility and overall improve quality of  life.  At a young age of 23, she is very petite but certainly we do not have any other good options at this point.  I do not think that  is a good idea for prevascularized fibular graft, given the fact this is now in end-stage avascular necrosis.  DESCRIPTION OF PROCEDURE:  After informed consent was obtained and appropriate right hip was marked, she was brought to the operating room and sat up on a stretcher where spinal anesthesia was then obtained.  We then laid her in supine position.  A Foley  catheter was placed.  I was able to assess her leg lengths and found her leg lengths to be equal.  Traction boots were placed on both her feet.  Next, I placed her supine on the Hana fracture table with perineal post in place and both legs in line  skeletal traction device and no traction applied.  Her right operative hip operative hip was prepped and draped with DuraPrep and sterile drapes.  A time-out was called.  She was identified as correct patient, correct right hip.  We then made an incision  just inferior and posterior to the anterior superior iliac spine and carried this obliquely down the leg.  I dissected down tensor fascia lata muscle and the tensor fascia was then divided longitudinally to proceed with direct anterior approach to the  hip.  We identified and cauterized circumflex vessels.  I then identified the hip capsule.  We opened the hip capsule in an L-type format, finding a moderate joint effusion consistent with avascular necrosis.  There was fibrillation in the femoral head  as well.  We placed Cobra retractors around the medial and lateral femoral neck and then we made our femoral neck cut with  an oscillating saw just proximal to the lesser trochanter and we completed this with an osteotome.  We placed a corkscrew guide in  the femoral head and removed the femoral head in its entirety and found it to be soft areas with cartilage breaking off.  We then placed a bent Hohmann over the medial acetabular rim and removed remnants of the acetabular labrum and other debris.  We  then began reaming from a size 44 reamer in stepwise  increments up to a size 47 with all reamers under direct visualization, the last reamer under direct fluoroscopy, so we could obtain our depth of reaming, our inclination and anteversion.  We then  placed the real DePuy Sector Gription acetabular component size 48 and we went with a 32+4 neutral polyethylene liner for that size acetabular component.  Attention was then turned to the femur.  With the leg externally rotated to 120 degrees, extended  and adducted, we were placed a Mueller retractor medially and Hohman retractor behind the greater trochanter.  We released lateral joint capsule and used a box-cutting osteotome to enter the femoral canal and a rongeur to lateralize.  We then began  broaching using the Actis broaching system from the starter broach to a 0 then up to a size 3.  With the size 3 in place, we trialed a standard offset femoral neck and we went with a 32+1 hip ball, reduced this in the acetabulum.  We felt like we needed  just a little bit more leg length and offset.  We dislocated the hip and removed the trial components.  We placed the real Actis standard offset femoral component, size 3 and went with a 32+5 ceramic hip ball.  Again we reduced this in the acetabulum.   We were pleased with her leg length, offset, range of motion and stability assessed clinically and radiographically.  We then irrigated the soft tissue with normal saline solution using pulsatile lavage.  We were able to close the joint capsule with  interrupted #1 Ethibond suture.  We closed the tensor fascia with #1 Vicryl followed by closing the deep tissue was 0 Vicryl, 2-0 Vicryl was used to close the subcutaneous tissue and 4-0 Monocryl subcuticular stitch was placed.  Steri-Strips were placed  on the skin as well as an Aquacel dressing.  She was taken off the Hana table and taken to recovery room in stable condition.  All final counts were correct.  There were no complications noted.  Of note, Benita Stabile, PA-C,  assisted the entire case.  His assistance was crucial for facilitating all  aspects of this case.  TN/NUANCE  D:02/02/2019 T:02/02/2019 JOB:[id number]

## 2019-02-02 NOTE — Anesthesia Postprocedure Evaluation (Signed)
Anesthesia Post Note  Patient: PLACIDA CAMBRE  Procedure(s) Performed: RIGHT TOTAL HIP ARTHROPLASTY ANTERIOR APPROACH (Right )     Patient location during evaluation: PACU Anesthesia Type: Spinal Level of consciousness: oriented and awake and alert Pain management: pain level controlled Vital Signs Assessment: post-procedure vital signs reviewed and stable Respiratory status: spontaneous breathing, respiratory function stable and patient connected to nasal cannula oxygen Cardiovascular status: blood pressure returned to baseline and stable Postop Assessment: no headache, no backache and no apparent nausea or vomiting Anesthetic complications: no    Last Vitals:  Vitals:   02/02/19 1042  BP: 108/79  Pulse: (!) 57  Resp: 16  Temp: 37.7 C  SpO2: 100%    Last Pain:  Vitals:   02/02/19 1042  TempSrc: Oral                 Damarkus Balis DAVID

## 2019-02-02 NOTE — Anesthesia Procedure Notes (Signed)
Spinal  Patient location during procedure: OR Start time: 02/02/2019 1:04 PM End time: 02/02/2019 1:09 PM Reason for block: post-op pain management Staffing Anesthesiologist: Montez Hageman, MD Resident/CRNA: Montel Clock, CRNA Performed: resident/CRNA  Preanesthetic Checklist Completed: patient identified, surgical consent, pre-op evaluation, timeout performed, IV checked, risks and benefits discussed and monitors and equipment checked Spinal Block Patient position: sitting Prep: DuraPrep Patient monitoring: heart rate, cardiac monitor, continuous pulse ox and blood pressure Approach: midline Location: L3-4 Injection technique: single-shot Needle Needle type: Pencan  Needle gauge: 24 G Needle length: 10 cm Needle insertion depth: 5 cm Assessment Sensory level: T6

## 2019-02-02 NOTE — Transfer of Care (Signed)
Immediate Anesthesia Transfer of Care Note  Patient: Daisy Ortiz  Procedure(s) Performed: RIGHT TOTAL HIP ARTHROPLASTY ANTERIOR APPROACH (Right )  Patient Location: PACU  Anesthesia Type:Spinal  Level of Consciousness: drowsy and patient cooperative  Airway & Oxygen Therapy: Patient Spontanous Breathing and Patient connected to face mask oxygen  Post-op Assessment: Report given to RN and Post -op Vital signs reviewed and stable  Post vital signs: Reviewed and stable  Last Vitals:  Vitals Value Taken Time  BP 92/51 02/02/19 1448  Temp    Pulse 74 02/02/19 1450  Resp 14 02/02/19 1450  SpO2 100 % 02/02/19 1450  Vitals shown include unvalidated device data.  Last Pain:  Vitals:   02/02/19 1042  TempSrc: Oral      Patients Stated Pain Goal: 4 (94/07/68 0881)  Complications: No apparent anesthesia complications

## 2019-02-02 NOTE — Brief Op Note (Signed)
02/02/2019  2:21 PM  PATIENT:  Daisy Ortiz  23 y.o. female  PRE-OPERATIVE DIAGNOSIS:  endstage osteoarthritis right hip  POST-OPERATIVE DIAGNOSIS:  endstage osteoarthritis right hip  PROCEDURE:  Procedure(s): RIGHT TOTAL HIP ARTHROPLASTY ANTERIOR APPROACH (Right)  SURGEON:  Surgeon(s) and Role:    Mcarthur Rossetti, MD - Primary  PHYSICIAN ASSISTANT:  Benita Stabile, PA-C  ANESTHESIA:   spinal  EBL:  350 mL   DICTATION: .Other Dictation: Dictation Number [id number]  PLAN OF CARE: Admit to inpatient   PATIENT DISPOSITION:  PACU - hemodynamically stable.   Delay start of Pharmacological VTE agent (>24hrs) due to surgical blood loss or risk of bleeding: no

## 2019-02-02 NOTE — Progress Notes (Signed)
Pt continues to have significant pain even after taking all prn meds. Notified Dr. Ninfa Linden and orders received.

## 2019-02-03 LAB — CBC
HCT: 30.5 % — ABNORMAL LOW (ref 36.0–46.0)
Hemoglobin: 9.4 g/dL — ABNORMAL LOW (ref 12.0–15.0)
MCH: 26.5 pg (ref 26.0–34.0)
MCHC: 30.8 g/dL (ref 30.0–36.0)
MCV: 85.9 fL (ref 80.0–100.0)
Platelets: 189 10*3/uL (ref 150–400)
RBC: 3.55 MIL/uL — ABNORMAL LOW (ref 3.87–5.11)
RDW: 14.1 % (ref 11.5–15.5)
WBC: 10 10*3/uL (ref 4.0–10.5)
nRBC: 0 % (ref 0.0–0.2)

## 2019-02-03 LAB — BASIC METABOLIC PANEL
Anion gap: 7 (ref 5–15)
BUN: <5 mg/dL — ABNORMAL LOW (ref 6–20)
CO2: 23 mmol/L (ref 22–32)
Calcium: 8.2 mg/dL — ABNORMAL LOW (ref 8.9–10.3)
Chloride: 104 mmol/L (ref 98–111)
Creatinine, Ser: 0.54 mg/dL (ref 0.44–1.00)
GFR calc Af Amer: >60 mL/min (ref >60)
GFR calc non Af Amer: >60 mL/min (ref >60)
Glucose, Bld: 108 mg/dL — ABNORMAL HIGH (ref 70–99)
Potassium: 3.9 mmol/L (ref 3.5–5.1)
Sodium: 134 mmol/L — ABNORMAL LOW (ref 135–145)

## 2019-02-03 MED ORDER — OXYCODONE HCL 5 MG PO TABS: 5.0000 mg | ORAL_TABLET | ORAL | 0 refills | Status: DC | PRN

## 2019-02-03 MED ORDER — ASPIRIN 81 MG PO CHEW: 81.0000 mg | CHEWABLE_TABLET | Freq: Two times a day (BID) | ORAL | 0 refills | Status: DC

## 2019-02-03 NOTE — Discharge Instructions (Signed)

## 2019-02-03 NOTE — Progress Notes (Signed)
PT Cancellation Note  Patient Details Name: Daisy Ortiz MRN: [id number] DOB: Aug 24, 1995   Cancelled Treatment:    Reason Eval/Treat Not Completed: Other (comment); attempted to see for second PT session, pt requested to continue sleeping. Will attempt again as schedule allows   [facility] 02/03/2019, 2:33 PM

## 2019-02-03 NOTE — Plan of Care (Signed)
°  Problem: Education: °Goal: Knowledge of the prescribed therapeutic regimen will improve °Outcome: Progressing °  °Problem: Clinical Measurements: °Goal: Postoperative complications will be avoided or minimized °Outcome: Progressing °  °Problem: Pain Management: °Goal: Pain level will decrease with appropriate interventions °Outcome: Progressing °  °

## 2019-02-03 NOTE — Progress Notes (Signed)
Physical Therapy Treatment Patient Details Name: Daisy Ortiz MRN: [id number] DOB: 1995-06-19 Today's Date: 02/03/2019    History of Present Illness s/p R DA THA. PMHx: crohn's dz, AVN L hip    PT Comments    Pt progressing toward goals.  Pt is hopeful to d/c tomorrow   Follow Up Recommendations  Follow surgeon's recommendation for DC plan and follow-up therapies     Equipment Recommendations  Rolling walker with 5" wheels    Recommendations for Other Services       Precautions / Restrictions Restrictions Weight Bearing Restrictions: No Other Position/Activity Restrictions: WBAT    Mobility  Bed Mobility Overal bed mobility: Needs Assistance Bed Mobility: Supine to Sit;Sit to Supine     Supine to sit: Supervision Sit to supine: Supervision   General bed mobility comments: incr time, supervision for safety  Transfers Overall transfer level: Needs assistance Equipment used: Rolling walker (2 wheeled) Transfers: Sit to/from Stand Sit to Stand: Min guard         General transfer comment: light assist to rise and stabilize, cues for hand placement and RLE position  Ambulation/Gait Ambulation/Gait assistance: Min guard;Supervision Gait Distance (Feet): 160 Feet Assistive device: Rolling walker (2 wheeled) Gait Pattern/deviations: Step-to pattern;Step-through pattern;Decreased stride length     General Gait Details: cues for hip/knee flexion on R, pt with limited hip/pelvis dissociation d/t significantly limited ROM prior to surgery   Stairs             Wheelchair Mobility    Modified Rankin (Stroke Patients Only)       Balance                                            Cognition Arousal/Alertness: Awake/alert Behavior During Therapy: WFL for tasks assessed/performed Overall Cognitive Status: Within Functional Limits for tasks assessed                                        Exercises Total Joint  Exercises Ankle Circles/Pumps: AROM;Both;10 reps Short Arc Quad: AROM;Right;10 reps Heel Slides: AAROM;Right;10 reps;AROM Hip ABduction/ADduction: AAROM;AROM;Right;10 reps    General Comments        Pertinent Vitals/Pain Pain Assessment: 0-10 Pain Score: 5  Pain Location: right hip Pain Descriptors / Indicators: Sore Pain Intervention(s): Limited activity within patient's tolerance;Monitored during session;Premedicated before session;Repositioned    Home Living                      Prior Function            PT Goals (current goals can now be found in the care plan section) Acute Rehab PT Goals Patient Stated Goal: to walk more normally PT Goal Formulation: With patient Time For Goal Achievement: 02/10/19 Potential to Achieve Goals: Good Progress towards PT goals: Progressing toward goals    Frequency    7X/week      PT Plan Current plan remains appropriate    Co-evaluation              AM-PAC PT "6 Clicks" Mobility   Outcome Measure  Help needed turning from your back to your side while in a flat bed without using bedrails?: A Little Help needed moving from lying on your back to sitting on the side  of a flat bed without using bedrails?: None Help needed moving to and from a bed to a chair (including a wheelchair)?: A Little Help needed standing up from a chair using your arms (e.g., wheelchair or bedside chair)?: A Little Help needed to walk in hospital room?: A Little Help needed climbing 3-5 steps with a railing? : A Little 6 Click Score: 19    End of Session Equipment Utilized During Treatment: Gait belt Activity Tolerance: Patient tolerated treatment well Patient left: in bed;with call bell/phone within reach;with family/visitor present   PT Visit Diagnosis: Difficulty in walking, not elsewhere classified (R26.2)     Time: 2440-1027 PT Time Calculation (min) (ACUTE ONLY): 18 min  Charges:  $Gait Training: 8-22 mins                      Kenyon Ana, PT  Pager: [phone] Acute Rehab Dept [facility]): [phone]   02/03/2019    [facility] 02/03/2019, 4:37 PM

## 2019-02-03 NOTE — Progress Notes (Signed)
  Subjective: Patient stable.  Having moderate pain.   Objective: Vital signs in last 24 hours: Temp:  [98 F (36.7 C)-99.8 F (37.7 C)] 99.7 F (37.6 C) (09/05 0602) Pulse Rate:  [46-80] 60 (09/05 0602) Resp:  [11-18] 16 (09/05 0602) BP: (82-116)/(47-81) 109/68 (09/05 0602) SpO2:  [77 %-100 %] 100 % (09/05 0602) Weight:  [54.6 kg] 54.6 kg (09/04 1035)  Intake/Output from previous day: 09/04 0701 - 09/05 0700 In: 4100.8 [P.O.:358; I.V.:3642.8; IV Piggyback:100] Out: 1975 [Urine:1625; Blood:350] Intake/Output this shift: No intake/output data recorded.  Exam:  Sensation intact distally Dorsiflexion/Plantar flexion intact Leg lengths equal Labs: Recent Labs    02/03/19 0219  HGB 9.4*   Recent Labs    02/03/19 0219  WBC 10.0  RBC 3.55*  HCT 30.5*  PLT 189   Recent Labs    02/03/19 0219  NA 134*  K 3.9  CL 104  CO2 23  BUN <5*  CREATININE 0.54  GLUCOSE 108*  CALCIUM 8.2*   No results for input(s): LABPT, INR in the last 72 hours.  Assessment/Plan: Plan at this time is mobilization with physical therapy.  Discontinue IV fluid.  Possible discharge next 1 to 2 days.   Landry Dyke Dean 02/03/2019, 8:41 AM

## 2019-02-03 NOTE — Evaluation (Signed)
Physical Therapy Evaluation Patient Details Name: Daisy Ortiz MRN: [id number] DOB: Sep 28, 1995 Today's Date: 02/03/2019   History of Present Illness  s/p R DA THA. PMHx: crohn's dz, AVN L hip  Clinical Impression  Pt is s/p THA resulting in the deficits listed below (see PT Problem List).  Gait limited by PT d/t pt being due for meds and pain level increasing with activity Pt will benefit from skilled PT to increase their independence and safety with mobility to allow discharge to the venue listed below.      Follow Up Recommendations Follow surgeon's recommendation for DC plan and follow-up therapies    Equipment Recommendations  Rolling walker with 5" wheels    Recommendations for Other Services       Precautions / Restrictions Precautions Precautions: Fall Restrictions Weight Bearing Restrictions: No Other Position/Activity Restrictions: WBAT      Mobility  Bed Mobility Overal bed mobility: Needs Assistance Bed Mobility: Supine to Sit     Supine to sit: Min assist     General bed mobility comments: assist with RLE  Transfers Overall transfer level: Needs assistance Equipment used: Rolling walker (2 wheeled) Transfers: Sit to/from Stand Sit to Stand: Min assist         General transfer comment: light assist to rise and stabilize, cues for hand placement and RLE position  Ambulation/Gait Ambulation/Gait assistance: Min assist Gait Distance (Feet): 30 Feet Assistive device: Rolling walker (2 wheeled) Gait Pattern/deviations: Step-to pattern     General Gait Details: cues for sequence  Stairs            Wheelchair Mobility    Modified Rankin (Stroke Patients Only)       Balance                                             Pertinent Vitals/Pain Pain Assessment: 0-10 Pain Score: 5  Pain Location: right hip Pain Descriptors / Indicators: Sore Pain Intervention(s): Limited activity within patient's tolerance;Monitored  during session    Home Living Family/patient expects to be discharged to:: Private residence Living Arrangements: Parent;Other relatives Available Help at Discharge: Family Type of Home: House Home Access: Stairs to enter Entrance Stairs-Rails: Right Entrance Stairs-Number of Steps: 4 Home Layout: Two level;1/2 bath on main level   Additional Comments: ordered RW and BSC    Prior Function Level of Independence: Independent               Hand Dominance        Extremity/Trunk Assessment   Upper Extremity Assessment Upper Extremity Assessment: Defer to OT evaluation    Lower Extremity Assessment Lower Extremity Assessment: RLE deficits/detail;LLE deficits/detail RLE Deficits / Details: limited by post op pain at hip; knee and ankle grossly WFL LLE Deficits / Details: hip flexion/IR/ER limited       Communication   Communication: No difficulties  Cognition Arousal/Alertness: Awake/alert Behavior During Therapy: WFL for tasks assessed/performed Overall Cognitive Status: Within Functional Limits for tasks assessed                                        General Comments      Exercises Total Joint Exercises Ankle Circles/Pumps: AROM;Both;10 reps   Assessment/Plan    PT Assessment Patient needs continued PT services  PT Problem List Decreased strength;Decreased range of motion;Decreased activity tolerance;Decreased mobility;Pain;Decreased knowledge of use of DME       PT Treatment Interventions DME instruction;Gait training;Functional mobility training;Therapeutic activities;Therapeutic exercise;Patient/family education;Stair training    PT Goals (Current goals can be found in the Care Plan section)  Acute Rehab PT Goals PT Goal Formulation: With patient Time For Goal Achievement: 02/10/19 Potential to Achieve Goals: Good    Frequency 7X/week   Barriers to discharge        Co-evaluation               AM-PAC PT "6 Clicks"  Mobility  Outcome Measure Help needed turning from your back to your side while in a flat bed without using bedrails?: A Little Help needed moving from lying on your back to sitting on the side of a flat bed without using bedrails?: A Little Help needed moving to and from a bed to a chair (including a wheelchair)?: A Little Help needed standing up from a chair using your arms (e.g., wheelchair or bedside chair)?: A Little   Help needed climbing 3-5 steps with a railing? : A Little 6 Click Score: 15    End of Session Equipment Utilized During Treatment: Gait belt Activity Tolerance: Patient tolerated treatment well Patient left: in chair;with call bell/phone within reach;with family/visitor present   PT Visit Diagnosis: Difficulty in walking, not elsewhere classified (R26.2)    Time: 8527-7824 PT Time Calculation (min) (ACUTE ONLY): 19 min   Charges:   PT Evaluation $PT Eval Low Complexity: 1 Low          Kenyon Ana, PT  Pager: [phone] Acute Rehab Dept [facility]): [phone]   02/03/2019   [facility] 02/03/2019, 12:55 PM

## 2019-02-04 ENCOUNTER — Other Ambulatory Visit: Payer: Self-pay

## 2019-02-04 MED ORDER — METHOCARBAMOL 500 MG PO TABS: 500.0000 mg | ORAL_TABLET | Freq: Three times a day (TID) | ORAL | 0 refills | Status: DC | PRN

## 2019-02-04 NOTE — TOC Initial Note (Signed)
Transition of Care [facility]) - Initial/Assessment Note    Patient Details  Name: Daisy Ortiz MRN: [id number] Date of Birth: [birth year]  Transition of Care (TOC) CM/SW Contact:    Joaquin Courts, RN Phone Number: 02/04/2019, 12:27 PM  Clinical Narrative:    CM spoke with patient. [employer] contacted regarding need for HHPT. Intake ID [id number]- [facility] to contact patient directly once agency assigned. [employer] to deliver rolling walker and 3-in-1 to bedside for home use.                Expected Discharge Plan: [place] Barriers to Discharge: No Barriers Identified   Patient Goals and CMS Choice Patient states their goals for this hospitalization and ongoing recovery are:: to go home      Expected Discharge Plan and Services Expected Discharge Plan: [place]   Discharge Planning Services: CM Consult   Living arrangements for the past 2 months: Single Family Home Expected Discharge Date: 02/04/19               DME Arranged: Berta Minor rolling DME Agency: [employer] Date DME Agency Contacted: 02/03/19 Time DME Agency Contacted: 56 Representative spoke with at DME Agency: [place]: PT       Representative spoke with at [place]: referral given to [employer]  Prior Living Arrangements/Services Living arrangements for the past 2 months: Single Family Home Lives with:: Parents Patient language and need for interpreter reviewed:: Yes Do you feel safe going back to the place where you live?: Yes      Need for Family Participation in Patient Care: Yes (Comment) Care giver support system in place?: Yes (comment)   Criminal Activity/Legal Involvement Pertinent to Current Situation/Hospitalization: No - Comment as needed  Activities of Daily Living Home Assistive Devices/Equipment: None ADL Screening (condition at time of admission) Patient's cognitive ability adequate to safely complete daily activities?:  Yes Is the patient deaf or have difficulty hearing?: No Does the patient have difficulty seeing, even when wearing glasses/contacts?: No Does the patient have difficulty concentrating, remembering, or making decisions?: No Patient able to express need for assistance with ADLs?: Yes Does the patient have difficulty dressing or bathing?: No Independently performs ADLs?: Yes (appropriate for developmental age) Does the patient have difficulty walking or climbing stairs?: Yes Weakness of Legs: Right Weakness of Arms/Hands: None  Permission Sought/Granted                  Emotional Assessment Appearance:: Appears stated age Attitude/Demeanor/Rapport: Engaged Affect (typically observed): Accepting Orientation: : Oriented to Place, Oriented to  Time, Oriented to Situation, Oriented to Self   Psych Involvement: No (comment)  Admission diagnosis:  endstage osteoarthritis right hip Patient Active Problem List   Diagnosis Date Noted  . Unilateral primary osteoarthritis, right hip 02/02/2019  . Status post total replacement of right hip 02/02/2019   PCP:  Helene Kelp, MD Pharmacy:   [id number] - [place], [place] - [address] [place] [address] Phone: [phone] Fax: [phone]     Social Determinants of Health (SDOH) Interventions    Readmission Risk Interventions No flowsheet data found.

## 2019-02-04 NOTE — Progress Notes (Addendum)
  Subjective: Daisy Ortiz is a 23 y.o. female s/p R THA.  They are POD2.  Pt's pain is controlled.  Pt has ambulated with some difficulty.   PT went well yesterday and she will attempt stairs in PT today.   Objective: Vital signs in last 24 hours: Temp:  [98.2 F (36.8 C)-99.6 F (37.6 C)] 99.3 F (37.4 C) (09/06 0630) Pulse Rate:  [66-110] 66 (09/06 0630) Resp:  [14-16] 16 (09/06 0630) BP: (92-114)/(51-83) 98/51 (09/06 0630) SpO2:  [98 %-100 %] 98 % (09/06 0630)  Intake/Output from previous day: 09/05 0701 - 09/06 0700 In: 1152.3 [P.O.:840; I.V.:312.3] Out: 2300 [Urine:2300] Intake/Output this shift: Total I/O In: 240 [P.O.:240] Out: -   Exam:  No gross blood or drainage overlying the dressing aside from a small spotting of blood.  Has not soaked through 2+ DP pulse Sensation intact distally in the R foot Able to dorsiflex and plantarflex the R foot   Labs: Recent Labs    02/03/19 0219  HGB 9.4*   Recent Labs    02/03/19 0219  WBC 10.0  RBC 3.55*  HCT 30.5*  PLT 189   Recent Labs    02/03/19 0219  NA 134*  K 3.9  CL 104  CO2 23  BUN <5*  CREATININE 0.54  GLUCOSE 108*  CALCIUM 8.2*   No results for input(s): LABPT, INR in the last 72 hours.  Assessment/Plan: Pt is POD2 s/p R THA.    -Plan to discharge to home today or tomorrow pending patient's pain and PT eval. If she does well in stair training today, will discharge home this afternoon  -WBAT with a walker  -Okay to shower, dressing is waterproof.  Cautioned patient against soaking dressing in bath/pool/body of water     Donella Stade 02/04/2019, 8:51 AM

## 2019-02-04 NOTE — Plan of Care (Signed)
  Problem: Clinical Measurements: Goal: Will remain free from infection Outcome: Progressing   Problem: Activity: Goal: Risk for activity intolerance will decrease Outcome: Progressing   Problem: Nutrition: Goal: Adequate nutrition will be maintained Outcome: Progressing   Problem: Pain Managment: Goal: General experience of comfort will improve Outcome: Progressing   

## 2019-02-04 NOTE — Progress Notes (Signed)
Physical Therapy Treatment Patient Details Name: Daisy Ortiz MRN: [id number] DOB: Sep 05, 1995 Today's Date: 02/04/2019    History of Present Illness s/p R DA THA. PMHx: crohn's dz, AVN L hip    PT Comments    Pt progressing well; incr gait distance today, did well with stairs/HEP; ready for d/c from PT standpoint   Follow Up Recommendations  Follow surgeon's recommendation for DC plan and follow-up therapies     Equipment Recommendations  Rolling walker with 5" wheels    Recommendations for Other Services       Precautions / Restrictions Precautions Precautions: Fall Restrictions Weight Bearing Restrictions: No Other Position/Activity Restrictions: WBAT    Mobility  Bed Mobility Overal bed mobility: Needs Assistance Bed Mobility: Supine to Sit;Sit to Supine     Supine to sit: Modified independent (Device/Increase time) Sit to supine: Modified independent (Device/Increase time)   General bed mobility comments: incr time, supervision for safety  Transfers Overall transfer level: Needs assistance Equipment used: Rolling walker (2 wheeled) Transfers: Sit to/from Stand Sit to Stand: Supervision;Modified independent (Device/Increase time)         General transfer comment: for safety  Ambulation/Gait Ambulation/Gait assistance: Supervision;Modified independent (Device/Increase time) Gait Distance (Feet): 200 Feet Assistive device: Rolling walker (2 wheeled) Gait Pattern/deviations: Step-through pattern;Decreased stride length     General Gait Details: cues for hip/knee flexion right LE, incr wt shift to RLE    Stairs Stairs: Yes Stairs assistance: Min guard;Supervision Stair Management: Alternating pattern;Two rails;One rail Left;Forwards Number of Stairs: 12 General stair comments: up/down stairs with one rail and 2 rails, able to perform reciprocal pattern   Wheelchair Mobility    Modified Rankin (Stroke Patients Only)       Balance                                            Cognition Arousal/Alertness: Awake/alert Behavior During Therapy: WFL for tasks assessed/performed Overall Cognitive Status: Within Functional Limits for tasks assessed                                        Exercises Total Joint Exercises Ankle Circles/Pumps: AROM;Both;10 reps Heel Slides: AAROM;Right;10 reps;AROM Hip ABduction/ADduction: AAROM;AROM;Right;10 reps;Supine;Standing(using gait belt as leg lifter )    General Comments        Pertinent Vitals/Pain Pain Assessment: 0-10 Pain Score: 3  Pain Location: right hip Pain Descriptors / Indicators: Sore Pain Intervention(s): Limited activity within patient's tolerance;Monitored during session;Premedicated before session;Repositioned    Home Living                      Prior Function            PT Goals (current goals can now be found in the care plan section) Acute Rehab PT Goals Patient Stated Goal: to walk more normally PT Goal Formulation: With patient Time For Goal Achievement: 02/10/19 Potential to Achieve Goals: Good Progress towards PT goals: Progressing toward goals    Frequency    7X/week      PT Plan Current plan remains appropriate    Co-evaluation              AM-PAC PT "6 Clicks" Mobility   Outcome Measure  Help needed turning from your back to your  side while in a flat bed without using bedrails?: None Help needed moving from lying on your back to sitting on the side of a flat bed without using bedrails?: None Help needed moving to and from a bed to a chair (including a wheelchair)?: None Help needed standing up from a chair using your arms (e.g., wheelchair or bedside chair)?: None Help needed to walk in hospital room?: None Help needed climbing 3-5 steps with a railing? : A Little 6 Click Score: 23    End of Session Equipment Utilized During Treatment: Gait belt Activity Tolerance: Patient tolerated  treatment well Patient left: in bed;with call bell/phone within reach;with family/visitor present   PT Visit Diagnosis: Difficulty in walking, not elsewhere classified (R26.2)     Time: 2130-8657 PT Time Calculation (min) (ACUTE ONLY): 24 min  Charges:  $Gait Training: 23-37 mins                     Kenyon Ana, PT  Pager: [phone] Acute Rehab Dept [facility]): [phone]   02/04/2019    [facility] 02/04/2019, 11:08 AM

## 2019-02-04 NOTE — Plan of Care (Signed)
Pt to dc home with family. No needs at time of discharge. No questions on d/c education and instructions given. Pt has all dme at bedside to d/c home.

## 2019-02-06 ENCOUNTER — Encounter (HOSPITAL_COMMUNITY): Payer: Self-pay | Admitting: Orthopaedic Surgery

## 2019-02-09 NOTE — Discharge Summary (Addendum)
Physician Discharge Summary      Patient ID: Daisy Ortiz MRN: [id number] DOB/AGE: 1995/08/20 23 y.o.  Admit date: 02/02/2019 Discharge date: 02/04/2019  Admission Diagnoses:  Principal Problem:   Unilateral primary osteoarthritis, right hip Active Problems:   Status post total replacement of right hip   Discharge Diagnoses:  Same  Surgeries: Procedure(s): RIGHT TOTAL HIP ARTHROPLASTY ANTERIOR APPROACH on 02/02/2019   Consultants:   Discharged Condition: Stable  Hospital Course: Daisy Ortiz is an 23 y.o. female who was admitted 02/02/2019 with a chief complaint of R hip pain, and found to have a diagnosis of R hip avascular necrosis.  They were brought to the operating room on 02/02/2019 and underwent the above named procedures.  Pt awoke from anesthesia without complication and was transferred to the floor.  On POD1, patient was doing well with moderate pain and did well in PT.  On POD2, pt's pain improved and she was able to do stairs training in PT.  She was discharged on POD2 and will f/u with Dr. Ninfa Linden in clinic.    Antibiotics given:  Anti-infectives (From admission, onward)   Start     Dose/Rate Route Frequency Ordered Stop   02/02/19 2000  ceFAZolin (ANCEF) IVPB 1 g/50 mL premix     1 g 100 mL/hr over 30 Minutes Intravenous Every 6 hours 02/02/19 1611 02/03/19 0222   02/02/19 1015  ceFAZolin (ANCEF) IVPB 2g/100 mL premix     2 g 200 mL/hr over 30 Minutes Intravenous On call to O.R. 02/02/19 1011 02/02/19 1315   02/02/19 1014  ceFAZolin (ANCEF) 2-4 GM/100ML-% IVPB    Note to Pharmacy: Randa Evens  : cabinet override      02/02/19 1014 02/02/19 1305    .  Recent vital signs:  Vitals:   02/04/19 0009 02/04/19 0630  BP: (!) 96/58 (!) 98/51  Pulse: 71 66  Resp: 16 16  Temp: 98.2 F (36.8 C) 99.3 F (37.4 C)  SpO2: 98% 98%    Recent laboratory studies:  Results for orders placed or performed during the hospital encounter of 02/02/19  hCG, serum,  qualitative (Not at [facility])  Result Value Ref Range   Preg, Serum NEGATIVE NEGATIVE  CBC  Result Value Ref Range   WBC 10.0 4.0 - 10.5 K/uL   RBC 3.55 (L) 3.87 - 5.11 MIL/uL   Hemoglobin 9.4 (L) 12.0 - 15.0 g/dL   HCT 30.5 (L) 36.0 - 46.0 %   MCV 85.9 80.0 - 100.0 fL   MCH 26.5 26.0 - 34.0 pg   MCHC 30.8 30.0 - 36.0 g/dL   RDW 14.1 11.5 - 15.5 %   Platelets 189 150 - 400 K/uL   nRBC 0.0 0.0 - 0.2 %  Basic metabolic panel  Result Value Ref Range   Sodium 134 (L) 135 - 145 mmol/L   Potassium 3.9 3.5 - 5.1 mmol/L   Chloride 104 98 - 111 mmol/L   CO2 23 22 - 32 mmol/L   Glucose, Bld 108 (H) 70 - 99 mg/dL   BUN <5 (L) 6 - 20 mg/dL   Creatinine, Ser 0.54 0.44 - 1.00 mg/dL   Calcium 8.2 (L) 8.9 - 10.3 mg/dL   GFR calc non Af Amer >60 >60 mL/min   GFR calc Af Amer >60 >60 mL/min   Anion gap 7 5 - 15    Discharge Medications:   Allergies as of 02/04/2019      Reactions   Infliximab Anaphylaxis, Shortness Of Breath,  Nausea And Vomiting, Swelling   Facial Edema (intolerance)      Medication List    TAKE these medications   acetaminophen 500 MG tablet Commonly known as: TYLENOL Take 500 mg by mouth 3 (three) times daily as needed for moderate pain or headache.   aspirin 81 MG chewable tablet Chew 1 tablet (81 mg total) by mouth 2 (two) times daily.   diazepam 5 MG tablet Commonly known as: VALIUM Take 5 mg by mouth every 8 (eight) hours as needed for muscle spasms or anxiety.   Entyvio 300 MG injection Generic drug: vedolizumab Inject 300 mg as directed every 14 (fourteen) days.   escitalopram 10 MG tablet Commonly known as: LEXAPRO Take 10 mg by mouth every evening.   IRON PO Take 1 tablet by mouth daily.   Junel FE 1.5/30 1.5-30 MG-MCG tablet Generic drug: norethindrone-ethinyl estradiol-iron Take 1 tablet by mouth every evening.   methocarbamol 500 MG tablet Commonly known as: Robaxin Take 1 tablet (500 mg total) by mouth every 8 (eight) hours as needed for  muscle spasms.   oxyCODONE 5 MG immediate release tablet Commonly known as: Oxy IR/ROXICODONE Take 1-2 tablets (5-10 mg total) by mouth every 4 (four) hours as needed for moderate pain (pain score 4-6).       Diagnostic Studies: Dg Pelvis Portable  Result Date: 02/02/2019 CLINICAL DATA:  Intraoperative imaging for right hip replacement. EXAM: OPERATIVE RIGHT HIP (WITH PELVIS IF PERFORMED) to VIEWS TECHNIQUE: Fluoroscopic spot image(s) were submitted for interpretation post-operatively. COMPARISON:  Plain films of the hips from [facility] 12/25/2018. FINDINGS: Two fluoroscopic spot views demonstrate a right total hip replacement. The device is located. No fracture. IMPRESSION: Intraoperative imaging for right hip replacement.  No acute finding. Electronically Signed   By: Drusilla Kanner M.D.   On: 02/02/2019 16:17   Dg C-arm 1-60 Min-no Report  Result Date: 02/02/2019 Fluoroscopy was utilized by the requesting physician.  No radiographic interpretation.   Dg Hip Operative Unilat With Pelvis Right  Result Date: 02/02/2019 CLINICAL DATA:  Intraoperative imaging for right hip replacement. EXAM: OPERATIVE RIGHT HIP (WITH PELVIS IF PERFORMED) to VIEWS TECHNIQUE: Fluoroscopic spot image(s) were submitted for interpretation post-operatively. COMPARISON:  Plain films of the hips from [facility] 12/25/2018. FINDINGS: Two fluoroscopic spot views demonstrate a right total hip replacement. The device is located. No fracture. IMPRESSION: Intraoperative imaging for right hip replacement.  No acute finding. Electronically Signed   By: Drusilla Kanner M.D.   On: 02/02/2019 16:17    Disposition:   Discharge Instructions    Call MD / Call 911   Complete by: As directed    If you experience chest pain or shortness of breath, CALL 911 and be transported to the hospital emergency room.  If you develope a fever above 101 F, pus (white drainage) or increased drainage or redness at the wound,  or calf pain, call your surgeon's office.   Constipation Prevention   Complete by: As directed    Drink plenty of fluids.  Prune juice may be helpful.  You may use a stool softener, such as Colace (over the counter) 100 mg twice a day.  Use MiraLax (over the counter) for constipation as needed.   Diet - low sodium heart healthy   Complete by: As directed    Discharge instructions   Complete by: As directed    You may shower, dressing is waterproof.  Do not remove the dressing, we will remove it at your first  post-op appointment.  Do not take a bath or soak the knee in a tub or pool.  You may weightbear as you can tolerate on the operative leg with a walker.  Follow-up with Dr. Magnus IvanBlackman in the clinic in 1-2 weeks at your given appointment date.   Increase activity slowly as tolerated   Complete by: As directed       Follow-up Information    Kathryne HitchBlackman, Christopher Y, MD Follow up in 2 week(s).   Specialty: Orthopedic Surgery Contact information: [address] [place] [address] [phone]            Signed: Julieanne CottonCharles L Maycel Riffe 02/09/2019, 1:51 PM

## 2019-02-11 ENCOUNTER — Encounter: Payer: Self-pay | Admitting: Orthopaedic Surgery

## 2019-02-19 ENCOUNTER — Encounter: Payer: Self-pay | Admitting: Orthopaedic Surgery

## 2019-02-19 ENCOUNTER — Ambulatory Visit (INDEPENDENT_AMBULATORY_CARE_PROVIDER_SITE_OTHER): Payer: 59 | Admitting: Orthopaedic Surgery

## 2019-02-19 DIAGNOSIS — Z96641 Presence of right artificial hip joint: Secondary | ICD-10-CM

## 2019-02-19 DIAGNOSIS — M87052 Idiopathic aseptic necrosis of left femur: Secondary | ICD-10-CM

## 2019-02-19 MED ORDER — OXYCODONE HCL 5 MG PO TABS: 5.0000 mg | ORAL_TABLET | Freq: Four times a day (QID) | ORAL | 0 refills | Status: DC | PRN

## 2019-02-19 NOTE — Progress Notes (Signed)
The patient is 2 weeks out from a right total hip arthroplasty.  This was secondary to avascular necrosis.  She is only 23 years old.  She does need a left total hip arthroplasty.  She feels like she is doing exceptionally well and I agree with this given the fact she is not using a walker.  She is already walking better than we saw her from before surgery.  She has no significant issues.  She does need a refill on oxycodone.  On examination of her right hip her incision looks great we are moved old Steri-Strips in place new ones.  She understands she can get this wet in the shower but not to submerge underwater for at least 2 more weeks.  We will see her back in 4 weeks to see how she is doing from a mobility standpoint.  I did refill her oxycodone.  All question concerns were answered and addressed.  She would like to proceed with her left hip sometime later in the year but at least after her [birth year] birthday when she turns 23.

## 2019-03-06 ENCOUNTER — Encounter: Payer: Self-pay | Admitting: Orthopaedic Surgery

## 2019-03-19 ENCOUNTER — Ambulatory Visit (INDEPENDENT_AMBULATORY_CARE_PROVIDER_SITE_OTHER): Payer: 59 | Admitting: Orthopaedic Surgery

## 2019-03-19 ENCOUNTER — Encounter: Payer: Self-pay | Admitting: Orthopaedic Surgery

## 2019-03-19 ENCOUNTER — Other Ambulatory Visit: Payer: Self-pay

## 2019-03-19 DIAGNOSIS — Z96641 Presence of right artificial hip joint: Secondary | ICD-10-CM

## 2019-03-19 DIAGNOSIS — M87052 Idiopathic aseptic necrosis of left femur: Secondary | ICD-10-CM

## 2019-03-19 NOTE — Progress Notes (Signed)
Office Visit Note   Patient: Daisy Ortiz           Date of Birth: 11-Mar-1996           MRN: [id number] Visit Date: 03/19/2019              Requested by: Helene Kelp, [place],  [place] [address] PCP: Helene Kelp, MD   Assessment & Plan: Visit Diagnoses:  1. Status post total replacement of right hip   2. Avascular necrosis of bone of hip, left ([place])     Plan: She will continue to work on scar tissue mobilization right hip.  We will set her up for a left total hip arthroplasty in the near future she would like this done sometime after November 9th.  She understands risk benefits of surgery and she is recently underwent a right total hip arthroplasty.  Should follow-up with [place] 2 weeks postop from the left total hip arthroplasty.  Questions were encouraged and answered at length.  Follow-Up Instructions: Return 2 weeks post-op.   Orders:  No orders of the defined types were placed in this encounter.  No orders of the defined types were placed in this encounter.     Procedures: No procedures performed   Clinical Data: No additional findings.   Subjective: Chief Complaint  Patient presents with  . Right Hip - Follow-up    HPI Daisy Ortiz returns today 45 days status post right total hip arthroplasty.  She states overall she is doing great.  She states this has she is felt in her life.  She is ready to have the left hip done.  She states she is overall getting up and down stairs better than she has.  Still having severe pain on the left hip.  She has known left hip avascular necrosis.  She does have some trouble sleeping due to her hip pain.  Review of Systems Negative for fevers chills shortness of breath chest pain  Objective: Vital Signs: There were no vitals taken for this visit.  Physical Exam Constitutional:      Appearance: She is not ill-appearing or diaphoretic.  Pulmonary:     Effort: Pulmonary effort is normal.   Neurological:     Mental Status: She is alert and oriented to person, place, and time.  Psychiatric:        Mood and Affect: Mood normal.     Ortho Exam Right hip surgical incisions healed well she has formed a keloid over the incision area.  But no signs of dehiscence or infection.  Right hip excellent range of motion.  Left hip no internal or external rotation secondary to pain.  Nontender over the right hip trochanteric region.  Right leg approximately 1 inch longer than the left. Specialty Comments:  No specialty comments available.  Imaging: No results found.   PMFS History: Patient Active Problem List   Diagnosis Date Noted  . Avascular necrosis of bone of hip, left ([place]) 03/19/2019  . Unilateral primary osteoarthritis, right hip 02/02/2019  . Status post total replacement of right hip 02/02/2019   Past Medical History:  Diagnosis Date  . Anemia   . 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 Crohn's disease ([place])   . Pneumonia    as a child  . Ulcerative colitis ([place])     No family history on file.  Past Surgical History:  Procedure Laterality Date  . COLONOSCOPY    .  ESOPHAGOGASTRODUODENOSCOPY    . TONSILLECTOMY    . TOTAL HIP ARTHROPLASTY Right 02/02/2019   Procedure: RIGHT TOTAL HIP ARTHROPLASTY ANTERIOR APPROACH;  Surgeon: Kathryne Hitch, MD;  Location: [facility];  Service: Orthopedics;  Laterality: Right;   Social History   Occupational History  . Not on file  Tobacco Use  . Smoking status: Never Smoker  . Smokeless tobacco: Never Used  Substance and Sexual Activity  . Alcohol use: Never    Frequency: Never  . Drug use: Never  . Sexual activity: Not Currently

## 2019-04-04 ENCOUNTER — Other Ambulatory Visit: Payer: Self-pay

## 2019-04-12 ENCOUNTER — Other Ambulatory Visit: Payer: Self-pay | Admitting: Physician Assistant

## 2019-04-16 ENCOUNTER — Encounter (HOSPITAL_COMMUNITY): Payer: Self-pay

## 2019-04-16 NOTE — Patient Instructions (Addendum)
DUE TO COVID-19 ONLY ONE VISITOR IS ALLOWED TO COME WITH YOU AND STAY IN THE WAITING ROOM ONLY DURING PRE OP AND PROCEDURE. THE ONE VISITOR MAY VISIT WITH YOU IN YOUR PRIVATE ROOM DURING VISITING HOURS ONLY!!   COVID SWAB TESTING MUST BE COMPLETED ON:  Today, Immediately after pre op appointment    [address] [facility] (Must self quarantine after testing. Follow instructions on handout.)             Your procedure is scheduled on: Friday, Nov. 20, 2020   Report to [facility] Main  Entrance    Report to admitting at 7:15 AM   Call this number if you have problems the morning of surgery [phone]   Do not eat food:After Midnight.   May have liquids until 6:45 AM day of surgery   CLEAR LIQUID DIET  Foods Allowed                                                                     Foods Excluded  Water, Black Coffee and tea, regular and decaf                             liquids that you cannot  Plain Jell-O in any flavor  (No red)                                           see through such as: Fruit ices (not with fruit pulp)                                     milk, soups, orange juice  Iced Popsicles (No red)                                    All solid food Carbonated beverages, regular and diet                                    Apple juices Sports drinks like Gatorade (No red) Lightly seasoned clear broth or consume(fat free) Sugar, honey syrup  Sample Menu Breakfast                                Lunch                                     Supper Cranberry juice                    Beef broth                            Chicken broth Jell-O  Grape juice                           Apple juice Coffee or tea                        Jell-O                                      Popsicle                                                Coffee or tea                         Coffee or tea   Complete one Ensure drink the morning of surgery at 6:45 AM  the day of surgery.   Brush your teeth the morning of surgery.   Do NOT smoke after Midnight   Take these medicines the morning of surgery with A SIP OF WATER: None                               You may not have any metal on your body including hair pins, jewelry, and body piercings             Do not wear make-up, lotions, powders, perfumes/cologne, or deodorant             Do not wear nail polish.  Do not shave  48 hours prior to surgery.                Do not bring valuables to the hospital. [facility] IS NOT             RESPONSIBLE   FOR VALUABLES.   Contacts, dentures or bridgework may not be worn into surgery.   Bring small overnight bag day of surgery.   Special Instructions: Bring a copy of your healthcare power of attorney and living will documents         the day of surgery if you haven't scanned them in before.              Please read over the following fact sheets you were given:  [facility] - Preparing for Surgery Before surgery, you can play an important role.  Because skin is not sterile, your skin needs to be as free of germs as possible.  You can reduce the number of germs on your skin by washing with CHG (chlorahexidine gluconate) soap before surgery.  CHG is an antiseptic cleaner which kills germs and bonds with the skin to continue killing germs even after washing. Please DO NOT use if you have an allergy to CHG or antibacterial soaps.  If your skin becomes reddened/irritated stop using the CHG and inform your nurse when you arrive at Short Stay. Do not shave (including legs and underarms) for at least 48 hours prior to the first CHG shower.  You may shave your face/neck.  Please follow these instructions carefully:  1.  Shower with CHG Soap the night before surgery and the  morning of surgery.  2.  If you choose to wash  your hair, wash your hair first as usual with your normal   shampoo.  3.  After you shampoo, rinse your hair and body thoroughly to remove the shampoo.                             4.  Use CHG as you would any other liquid soap.  You can apply chg directly to the skin and wash.  Gently with a scrungie or clean washcloth.  5.  Apply the CHG Soap to your body ONLY FROM THE NECK DOWN.   Do   not use on face/ open                           Wound or open sores. Avoid contact with eyes, ears mouth and   genitals (private parts).                       Wash face,  Genitals (private parts) with your normal soap.             6.  Wash thoroughly, paying special attention to the area where your    surgery  will be performed.  7.  Thoroughly rinse your body with warm water from the neck down.  8.  DO NOT shower/wash with your normal soap after using and rinsing off the CHG Soap.                9.  Pat yourself dry with a clean towel.            10.  Wear clean pajamas.            11.  Place clean sheets on your bed the night of your first shower and do not  sleep with pets. Day of Surgery : Do not apply any lotions/deodorants the morning of surgery.  Please wear clean clothes to the hospital/surgery center.  FAILURE TO FOLLOW THESE INSTRUCTIONS MAY RESULT IN THE CANCELLATION OF YOUR SURGERY  PATIENT SIGNATURE_________________________________  NURSE SIGNATURE__________________________________  ________________________________________________________________________   Daisy MireIncentive Spirometer  An incentive spirometer is a tool that can help keep your lungs clear and active. This tool measures how well you are filling your lungs with each breath. Taking long deep breaths may help reverse or decrease the chance of developing breathing (pulmonary) problems (especially infection) following:  A long period of time when you are unable to move or be active. BEFORE THE PROCEDURE   If the spirometer includes an indicator to show your best effort, your nurse or respiratory  therapist will set it to a desired goal.  If possible, sit up straight or lean slightly forward. Try not to slouch.  Hold the incentive spirometer in an upright position. INSTRUCTIONS FOR USE  1. Sit on the edge of your bed if possible, or sit up as far as you can in bed or on a chair. 2. Hold the incentive spirometer in an upright position. 3. Breathe out normally. 4. Place the mouthpiece in your mouth and seal your lips tightly around it. 5. Breathe in slowly and as deeply as possible, raising the piston or the ball toward the top of the column. 6. Hold your breath for 3-5 seconds or for as long as possible. Allow the piston or ball to fall to the bottom of the column. 7. Remove the mouthpiece from your mouth and breathe out normally. 8.  Rest for a few seconds and repeat Steps 1 through 7 at least 10 times every 1-2 hours when you are awake. Take your time and take a few normal breaths between deep breaths. 9. The spirometer may include an indicator to show your best effort. Use the indicator as a goal to work toward during each repetition. 10. After each set of 10 deep breaths, practice coughing to be sure your lungs are clear. If you have an incision (the cut made at the time of surgery), support your incision when coughing by placing a pillow or rolled up towels firmly against it. Once you are able to get out of bed, walk around indoors and cough well. You may stop using the incentive spirometer when instructed by your caregiver.  RISKS AND COMPLICATIONS  Take your time so you do not get dizzy or light-headed.  If you are in pain, you may need to take or ask for pain medication before doing incentive spirometry. It is harder to take a deep breath if you are having pain. AFTER USE  Rest and breathe slowly and easily.  It can be helpful to keep track of a log of your progress. Your caregiver can provide you with a simple table to help with this. If you are using the spirometer at home,  follow these instructions: SEEK MEDICAL CARE IF:   You are having difficultly using the spirometer.  You have trouble using the spirometer as often as instructed.  Your pain medication is not giving enough relief while using the spirometer.  You develop fever of 100.5 F (38.1 C) or higher. SEEK IMMEDIATE MEDICAL CARE IF:   You cough up bloody sputum that had not been present before.  You develop fever of 102 F (38.9 C) or greater.  You develop worsening pain at or near the incision site. MAKE SURE YOU:   Understand these instructions.  Will watch your condition.  Will get help right away if you are not doing well or get worse. Document Released: 09/27/2006 Document Revised: 08/09/2011 Document Reviewed: 11/28/2006 [facility] Patient Information 2014 [place], [place].   ________________________________________________________________________

## 2019-04-17 ENCOUNTER — Other Ambulatory Visit: Payer: Self-pay

## 2019-04-17 ENCOUNTER — Encounter (HOSPITAL_COMMUNITY): Payer: Self-pay

## 2019-04-17 ENCOUNTER — Encounter (HOSPITAL_COMMUNITY)
Admission: RE | Admit: 2019-04-17 | Discharge: 2019-04-17 | Disposition: A | Discharge status: Home / Self Care | Payer: 59 | Source: Ambulatory Visit | Attending: Orthopaedic Surgery | Admitting: Orthopaedic Surgery

## 2019-04-17 ENCOUNTER — Other Ambulatory Visit (HOSPITAL_COMMUNITY)
Admission: RE | Admit: 2019-04-17 | Discharge: 2019-04-17 | Disposition: A | Discharge status: Home / Self Care | Payer: 59 | Source: Ambulatory Visit | Attending: Orthopaedic Surgery | Admitting: Orthopaedic Surgery

## 2019-04-17 DIAGNOSIS — M87852 Other osteonecrosis, left femur: Secondary | ICD-10-CM | POA: Insufficient documentation

## 2019-04-17 DIAGNOSIS — Z01812 Encounter for preprocedural laboratory examination: Secondary | ICD-10-CM | POA: Insufficient documentation

## 2019-04-17 DIAGNOSIS — Z20828 Contact with and (suspected) exposure to other viral communicable diseases: Secondary | ICD-10-CM | POA: Insufficient documentation

## 2019-04-17 HISTORY — DX: Major depressive disorder, single episode, unspecified: F32.9

## 2019-04-17 HISTORY — DX: Generalized anxiety disorder: F41.1

## 2019-04-17 HISTORY — DX: Sciatica, right side: M54.31

## 2019-04-17 LAB — CBC
HCT: 42 % (ref 36.0–46.0)
Hemoglobin: 12.7 g/dL (ref 12.0–15.0)
MCH: 25.3 pg — ABNORMAL LOW (ref 26.0–34.0)
MCHC: 30.2 g/dL (ref 30.0–36.0)
MCV: 83.7 fL (ref 80.0–100.0)
Platelets: 294 10*3/uL (ref 150–400)
RBC: 5.02 MIL/uL (ref 3.87–5.11)
RDW: 13.5 % (ref 11.5–15.5)
WBC: 6.3 10*3/uL (ref 4.0–10.5)
nRBC: 0 % (ref 0.0–0.2)

## 2019-04-17 NOTE — Pre-Procedure Instructions (Signed)
PCP - Dr. Levester Fresh. Last office visit three months ago. Cardiologist - NA  Chest x-ray -  EKG -  Stress Test -  ECHO -  Cardiac Cath -   Sleep Study - NA CPAP -   Fasting Blood Sugar - NA Checks Blood Sugar _____ times a day  Blood Thinner Instructions:NA Aspirin Instructions: Last Dose:  Anesthesia review:   Patient denies shortness of breath, fever, cough and chest pain at PAT appointment   Patient verbalized understanding of instructions that were given to them at the PAT appointment. Patient was also instructed that they will need to review over the PAT instructions again at home before surgery.

## 2019-04-18 LAB — NOVEL CORONAVIRUS, NAA (HOSP ORDER, SEND-OUT TO REF LAB; TAT 18-24 HRS): SARS-CoV-2, NAA: NOT DETECTED

## 2019-04-18 NOTE — Progress Notes (Signed)
Nasal swap results send to Dr. Ninfa Linden via [facility].

## 2019-04-19 LAB — NASOPHARYNGEAL CULTURE

## 2019-04-20 ENCOUNTER — Inpatient Hospital Stay (HOSPITAL_COMMUNITY): Payer: 59 | Admitting: Physician Assistant

## 2019-04-20 ENCOUNTER — Inpatient Hospital Stay (HOSPITAL_COMMUNITY): Payer: 59

## 2019-04-20 ENCOUNTER — Encounter (HOSPITAL_COMMUNITY)
Admission: RE | Disposition: A | Discharge status: Home / Self Care | Payer: Self-pay | Source: Home / Self Care | Attending: Orthopaedic Surgery

## 2019-04-20 ENCOUNTER — Other Ambulatory Visit: Payer: Self-pay

## 2019-04-20 ENCOUNTER — Encounter (HOSPITAL_COMMUNITY): Payer: Self-pay | Admitting: *Deleted

## 2019-04-20 ENCOUNTER — Inpatient Hospital Stay (HOSPITAL_COMMUNITY)
Admission: RE | Admit: 2019-04-20 | Discharge: 2019-04-21 | DRG: 470 | Disposition: A | Discharge status: Home Health | Payer: 59 | Attending: Orthopaedic Surgery | Admitting: Orthopaedic Surgery

## 2019-04-20 ENCOUNTER — Inpatient Hospital Stay (HOSPITAL_COMMUNITY): Payer: 59 | Admitting: Certified Registered"

## 2019-04-20 DIAGNOSIS — T380X5A Adverse effect of glucocorticoids and synthetic analogues, initial encounter: Secondary | ICD-10-CM | POA: Diagnosis present

## 2019-04-20 DIAGNOSIS — D62 Acute posthemorrhagic anemia: Secondary | ICD-10-CM | POA: Diagnosis not present

## 2019-04-20 DIAGNOSIS — F329 Major depressive disorder, single episode, unspecified: Secondary | ICD-10-CM | POA: Diagnosis present

## 2019-04-20 DIAGNOSIS — Z96641 Presence of right artificial hip joint: Secondary | ICD-10-CM | POA: Diagnosis present

## 2019-04-20 DIAGNOSIS — K5090 Crohn's disease, unspecified, without complications: Secondary | ICD-10-CM | POA: Diagnosis present

## 2019-04-20 DIAGNOSIS — Z419 Encounter for procedure for purposes other than remedying health state, unspecified: Secondary | ICD-10-CM

## 2019-04-20 DIAGNOSIS — Z7982 Long term (current) use of aspirin: Secondary | ICD-10-CM

## 2019-04-20 DIAGNOSIS — F411 Generalized anxiety disorder: Secondary | ICD-10-CM | POA: Diagnosis present

## 2019-04-20 DIAGNOSIS — Z888 Allergy status to other drugs, medicaments and biological substances status: Secondary | ICD-10-CM

## 2019-04-20 DIAGNOSIS — M87052 Idiopathic aseptic necrosis of left femur: Secondary | ICD-10-CM | POA: Diagnosis not present

## 2019-04-20 DIAGNOSIS — Z20828 Contact with and (suspected) exposure to other viral communicable diseases: Secondary | ICD-10-CM | POA: Diagnosis present

## 2019-04-20 DIAGNOSIS — M87152 Osteonecrosis due to drugs, left femur: Principal | ICD-10-CM | POA: Diagnosis present

## 2019-04-20 DIAGNOSIS — Z96642 Presence of left artificial hip joint: Secondary | ICD-10-CM

## 2019-04-20 DIAGNOSIS — Z79899 Other long term (current) drug therapy: Secondary | ICD-10-CM

## 2019-04-20 HISTORY — PX: TOTAL HIP ARTHROPLASTY: SHX124

## 2019-04-20 LAB — HCG, SERUM, QUALITATIVE: Preg, Serum: NEGATIVE

## 2019-04-20 SURGERY — ARTHROPLASTY, HIP, TOTAL, ANTERIOR APPROACH
Anesthesia: Spinal | Site: Hip | Laterality: Left

## 2019-04-20 MED ORDER — METOCLOPRAMIDE HCL 5 MG/ML IJ SOLN: 5.0000 mg | Freq: Three times a day (TID) | INTRAMUSCULAR | Status: DC | PRN

## 2019-04-20 MED ORDER — DOCUSATE SODIUM 100 MG PO CAPS
100.0000 mg | ORAL_CAPSULE | Freq: Two times a day (BID) | ORAL | Status: DC
  Administered 2019-04-20 – 2019-04-21 (×2): 100 mg via ORAL
  Filled 2019-04-20 (×2): qty 1

## 2019-04-20 MED ORDER — ONDANSETRON HCL 4 MG/2ML IJ SOLN: 4.0000 mg | Freq: Four times a day (QID) | INTRAMUSCULAR | Status: DC | PRN

## 2019-04-20 MED ORDER — KETOROLAC TROMETHAMINE 30 MG/ML IJ SOLN
INTRAMUSCULAR | Status: AC
  Filled 2019-04-20: qty 1

## 2019-04-20 MED ORDER — PANTOPRAZOLE SODIUM 40 MG PO TBEC
40.0000 mg | DELAYED_RELEASE_TABLET | Freq: Every day | ORAL | Status: DC
  Administered 2019-04-21: 40 mg via ORAL
  Filled 2019-04-20: qty 1

## 2019-04-20 MED ORDER — ESMOLOL HCL 100 MG/10ML IV SOLN
INTRAVENOUS | Status: DC | PRN
  Administered 2019-04-20: 40 mg via INTRAVENOUS
  Administered 2019-04-20 (×2): 10 mg via INTRAVENOUS
  Administered 2019-04-20: 30 mg via INTRAVENOUS
  Administered 2019-04-20: 10 mg via INTRAVENOUS

## 2019-04-20 MED ORDER — LIDOCAINE 2% (20 MG/ML) 5 ML SYRINGE
INTRAMUSCULAR | Status: AC
  Filled 2019-04-20: qty 5

## 2019-04-20 MED ORDER — FENTANYL CITRATE (PF) 100 MCG/2ML IJ SOLN
INTRAMUSCULAR | Status: AC
  Filled 2019-04-20: qty 2

## 2019-04-20 MED ORDER — PHENYLEPHRINE HCL-NACL 10-0.9 MG/250ML-% IV SOLN
INTRAVENOUS | Status: DC | PRN
  Administered 2019-04-20: 30 ug/min via INTRAVENOUS

## 2019-04-20 MED ORDER — ESCITALOPRAM OXALATE 10 MG PO TABS
5.0000 mg | ORAL_TABLET | Freq: Every day | ORAL | Status: DC
  Administered 2019-04-21: 5 mg via ORAL
  Filled 2019-04-20: qty 1

## 2019-04-20 MED ORDER — ASPIRIN 81 MG PO CHEW
81.0000 mg | CHEWABLE_TABLET | Freq: Two times a day (BID) | ORAL | Status: DC
  Administered 2019-04-20 – 2019-04-21 (×2): 81 mg via ORAL
  Filled 2019-04-20 (×2): qty 1

## 2019-04-20 MED ORDER — ONDANSETRON HCL 4 MG PO TABS: 4.0000 mg | ORAL_TABLET | Freq: Four times a day (QID) | ORAL | Status: DC | PRN

## 2019-04-20 MED ORDER — BUPIVACAINE IN DEXTROSE 0.75-8.25 % IT SOLN
INTRATHECAL | Status: DC | PRN
  Administered 2019-04-20: 1.8 mL via INTRATHECAL

## 2019-04-20 MED ORDER — PROPOFOL 10 MG/ML IV BOLUS
INTRAVENOUS | Status: AC
  Filled 2019-04-20: qty 20

## 2019-04-20 MED ORDER — LIDOCAINE 2% (20 MG/ML) 5 ML SYRINGE
INTRAMUSCULAR | Status: DC | PRN
  Administered 2019-04-20 (×2): 60 mg via INTRAVENOUS

## 2019-04-20 MED ORDER — 0.9 % SODIUM CHLORIDE (POUR BTL) OPTIME
TOPICAL | Status: DC | PRN
  Administered 2019-04-20: 1000 mL

## 2019-04-20 MED ORDER — PROMETHAZINE HCL 25 MG/ML IJ SOLN: 6.2500 mg | INTRAMUSCULAR | Status: DC | PRN

## 2019-04-20 MED ORDER — CHLORHEXIDINE GLUCONATE 4 % EX LIQD: 60.0000 mL | Freq: Once | CUTANEOUS | Status: DC

## 2019-04-20 MED ORDER — POVIDONE-IODINE 10 % EX SWAB
2.0000 "application " | Freq: Once | CUTANEOUS | Status: AC
  Administered 2019-04-20: 2 via TOPICAL

## 2019-04-20 MED ORDER — SODIUM CHLORIDE 0.9 % IV SOLN
INTRAVENOUS | Status: DC
  Administered 2019-04-20 – 2019-04-21 (×2): via INTRAVENOUS

## 2019-04-20 MED ORDER — FERROUS SULFATE 325 (65 FE) MG PO TABS
325.0000 mg | ORAL_TABLET | Freq: Every day | ORAL | Status: DC
  Administered 2019-04-21: 325 mg via ORAL
  Filled 2019-04-20: qty 1

## 2019-04-20 MED ORDER — MENTHOL 3 MG MT LOZG: 1.0000 | LOZENGE | OROMUCOSAL | Status: DC | PRN

## 2019-04-20 MED ORDER — PROPOFOL 10 MG/ML IV BOLUS
INTRAVENOUS | Status: DC | PRN
  Administered 2019-04-20: 30 mg via INTRAVENOUS
  Administered 2019-04-20 (×2): 20 mg via INTRAVENOUS
  Administered 2019-04-20: 10 mg via INTRAVENOUS
  Administered 2019-04-20: 30 mg via INTRAVENOUS
  Administered 2019-04-20: 20 mg via INTRAVENOUS
  Administered 2019-04-20: 10 mg via INTRAVENOUS

## 2019-04-20 MED ORDER — MIDAZOLAM HCL 2 MG/2ML IJ SOLN
INTRAMUSCULAR | Status: AC
  Filled 2019-04-20: qty 2

## 2019-04-20 MED ORDER — EPHEDRINE SULFATE-NACL 50-0.9 MG/10ML-% IV SOSY
PREFILLED_SYRINGE | INTRAVENOUS | Status: DC | PRN
  Administered 2019-04-20: 10 mg via INTRAVENOUS

## 2019-04-20 MED ORDER — HYDROMORPHONE HCL 1 MG/ML IJ SOLN
0.5000 mg | INTRAMUSCULAR | Status: DC | PRN
  Administered 2019-04-20: 0.5 mg via INTRAVENOUS
  Administered 2019-04-20 – 2019-04-21 (×2): 1 mg via INTRAVENOUS
  Filled 2019-04-20 (×4): qty 1

## 2019-04-20 MED ORDER — HYDROMORPHONE HCL 1 MG/ML IJ SOLN
INTRAMUSCULAR | Status: AC
  Filled 2019-04-20: qty 2

## 2019-04-20 MED ORDER — FENTANYL CITRATE (PF) 100 MCG/2ML IJ SOLN
INTRAMUSCULAR | Status: DC | PRN
  Administered 2019-04-20: 25 ug via INTRAVENOUS

## 2019-04-20 MED ORDER — CEFAZOLIN SODIUM-DEXTROSE 1-4 GM/50ML-% IV SOLN
1.0000 g | Freq: Four times a day (QID) | INTRAVENOUS | Status: AC
  Administered 2019-04-20 (×2): 1 g via INTRAVENOUS
  Filled 2019-04-20 (×2): qty 50

## 2019-04-20 MED ORDER — METHOCARBAMOL 500 MG IVPB - SIMPLE MED
INTRAVENOUS | Status: AC
  Filled 2019-04-20: qty 50

## 2019-04-20 MED ORDER — MIDAZOLAM HCL 5 MG/5ML IJ SOLN
INTRAMUSCULAR | Status: DC | PRN
  Administered 2019-04-20: 2 mg via INTRAVENOUS

## 2019-04-20 MED ORDER — PHENOL 1.4 % MT LIQD: 1.0000 | OROMUCOSAL | Status: DC | PRN

## 2019-04-20 MED ORDER — ALBUMIN HUMAN 5 % IV SOLN
INTRAVENOUS | Status: DC | PRN
  Administered 2019-04-20: 11:00:00 via INTRAVENOUS

## 2019-04-20 MED ORDER — HYDROMORPHONE HCL 1 MG/ML IJ SOLN
0.2500 mg | INTRAMUSCULAR | Status: DC | PRN
  Administered 2019-04-20 (×4): 0.5 mg via INTRAVENOUS

## 2019-04-20 MED ORDER — GLYCOPYRROLATE PF 0.2 MG/ML IJ SOSY
PREFILLED_SYRINGE | INTRAMUSCULAR | Status: DC | PRN
  Administered 2019-04-20: .2 mg via INTRAVENOUS

## 2019-04-20 MED ORDER — OXYCODONE HCL 5 MG PO TABS
5.0000 mg | ORAL_TABLET | ORAL | Status: DC | PRN
  Administered 2019-04-20: 10 mg via ORAL
  Filled 2019-04-20 (×4): qty 2

## 2019-04-20 MED ORDER — METHOCARBAMOL 500 MG PO TABS
500.0000 mg | ORAL_TABLET | Freq: Four times a day (QID) | ORAL | Status: DC | PRN
  Administered 2019-04-20 – 2019-04-21 (×2): 500 mg via ORAL
  Filled 2019-04-20 (×2): qty 1

## 2019-04-20 MED ORDER — SODIUM CHLORIDE 0.9 % IR SOLN
Status: DC | PRN
  Administered 2019-04-20: 1

## 2019-04-20 MED ORDER — OXYCODONE HCL 5 MG PO TABS: 5.0000 mg | ORAL_TABLET | Freq: Once | ORAL | Status: DC | PRN

## 2019-04-20 MED ORDER — LACTATED RINGERS IV SOLN
INTRAVENOUS | Status: DC
  Administered 2019-04-20 (×2): via INTRAVENOUS

## 2019-04-20 MED ORDER — ACETAMINOPHEN 325 MG PO TABS
325.0000 mg | ORAL_TABLET | Freq: Four times a day (QID) | ORAL | Status: DC | PRN
  Administered 2019-04-21: 650 mg via ORAL
  Filled 2019-04-20: qty 2

## 2019-04-20 MED ORDER — ONDANSETRON HCL 4 MG/2ML IJ SOLN
INTRAMUSCULAR | Status: DC | PRN
  Administered 2019-04-20: 4 mg via INTRAVENOUS

## 2019-04-20 MED ORDER — METHOCARBAMOL 500 MG IVPB - SIMPLE MED
500.0000 mg | Freq: Four times a day (QID) | INTRAVENOUS | Status: DC | PRN
  Administered 2019-04-20: 500 mg via INTRAVENOUS
  Filled 2019-04-20: qty 50

## 2019-04-20 MED ORDER — CEFAZOLIN SODIUM-DEXTROSE 2-4 GM/100ML-% IV SOLN
2.0000 g | INTRAVENOUS | Status: AC
  Administered 2019-04-20: 2 g via INTRAVENOUS

## 2019-04-20 MED ORDER — MEPERIDINE HCL 50 MG/ML IJ SOLN: 6.2500 mg | INTRAMUSCULAR | Status: DC | PRN

## 2019-04-20 MED ORDER — PHENYLEPHRINE 40 MCG/ML (10ML) SYRINGE FOR IV PUSH (FOR BLOOD PRESSURE SUPPORT)
PREFILLED_SYRINGE | INTRAVENOUS | Status: DC | PRN
  Administered 2019-04-20: 80 ug via INTRAVENOUS
  Administered 2019-04-20: 40 ug via INTRAVENOUS

## 2019-04-20 MED ORDER — OXYCODONE HCL 5 MG/5ML PO SOLN: 5.0000 mg | Freq: Once | ORAL | Status: DC | PRN

## 2019-04-20 MED ORDER — DIPHENHYDRAMINE HCL 12.5 MG/5ML PO ELIX: 12.5000 mg | ORAL_SOLUTION | ORAL | Status: DC | PRN

## 2019-04-20 MED ORDER — ALUM & MAG HYDROXIDE-SIMETH 200-200-20 MG/5ML PO SUSP: 30.0000 mL | ORAL | Status: DC | PRN

## 2019-04-20 MED ORDER — KETOROLAC TROMETHAMINE 30 MG/ML IJ SOLN
30.0000 mg | Freq: Once | INTRAMUSCULAR | Status: AC | PRN
  Administered 2019-04-20: 30 mg via INTRAVENOUS

## 2019-04-20 MED ORDER — TRANEXAMIC ACID-NACL 1000-0.7 MG/100ML-% IV SOLN
1000.0000 mg | INTRAVENOUS | Status: AC
  Administered 2019-04-20: 1000 mg via INTRAVENOUS

## 2019-04-20 MED ORDER — DEXAMETHASONE SODIUM PHOSPHATE 10 MG/ML IJ SOLN
INTRAMUSCULAR | Status: DC | PRN
  Administered 2019-04-20: 10 mg via INTRAVENOUS

## 2019-04-20 MED ORDER — CEFAZOLIN SODIUM-DEXTROSE 2-4 GM/100ML-% IV SOLN
INTRAVENOUS | Status: AC
  Filled 2019-04-20: qty 100

## 2019-04-20 MED ORDER — PROPOFOL 500 MG/50ML IV EMUL
INTRAVENOUS | Status: DC | PRN
  Administered 2019-04-20: 30 ug/kg/min via INTRAVENOUS

## 2019-04-20 MED ORDER — OXYCODONE HCL 5 MG PO TABS
10.0000 mg | ORAL_TABLET | ORAL | Status: DC | PRN
  Administered 2019-04-20 (×2): 10 mg via ORAL
  Administered 2019-04-21 (×2): 15 mg via ORAL
  Administered 2019-04-21: 10 mg via ORAL
  Filled 2019-04-20 (×2): qty 3

## 2019-04-20 MED ORDER — POLYETHYLENE GLYCOL 3350 17 G PO PACK: 17.0000 g | PACK | Freq: Every day | ORAL | Status: DC | PRN

## 2019-04-20 MED ORDER — TRANEXAMIC ACID-NACL 1000-0.7 MG/100ML-% IV SOLN
INTRAVENOUS | Status: AC
  Filled 2019-04-20: qty 100

## 2019-04-20 MED ORDER — METOCLOPRAMIDE HCL 5 MG PO TABS: 5.0000 mg | ORAL_TABLET | Freq: Three times a day (TID) | ORAL | Status: DC | PRN

## 2019-04-20 SURGICAL SUPPLY — 42 items
BAG ZIPLOCK 12X15 (MISCELLANEOUS) IMPLANT
BALL HIP CERAMIC (Hips) IMPLANT
BENZOIN TINCTURE PRP APPL 2/3 (GAUZE/BANDAGES/DRESSINGS) ×2 IMPLANT
BLADE SAW SGTL 18X1.27X75 (BLADE) ×2 IMPLANT
BLADE SAW SGTL 18X1.27X75MM (BLADE) ×1
CLOSURE WOUND 1/2 X4 (GAUZE/BANDAGES/DRESSINGS) ×1
COVER PERINEAL POST (MISCELLANEOUS) ×3 IMPLANT
COVER SURGICAL LIGHT HANDLE (MISCELLANEOUS) ×3 IMPLANT
COVER WAND RF STERILE (DRAPES) ×3 IMPLANT
CUP ACET PINNACLE SECTR 48MM (Joint) IMPLANT
DRAPE STERI IOBAN 125X83 (DRAPES) ×3 IMPLANT
DRAPE U-SHAPE 47X51 STRL (DRAPES) ×6 IMPLANT
DRSG AQUACEL AG ADV 3.5X10 (GAUZE/BANDAGES/DRESSINGS) ×3 IMPLANT
DURAPREP 26ML APPLICATOR (WOUND CARE) ×3 IMPLANT
ELECT REM PT RETURN 15FT ADLT (MISCELLANEOUS) ×3 IMPLANT
GAUZE XEROFORM 1X8 LF (GAUZE/BANDAGES/DRESSINGS) ×3 IMPLANT
GLOVE BIO SURGEON STRL SZ7.5 (GLOVE) ×3 IMPLANT
GLOVE BIOGEL PI IND STRL 8 (GLOVE) ×2 IMPLANT
GLOVE BIOGEL PI INDICATOR 8 (GLOVE) ×4
GLOVE ECLIPSE 8.0 STRL XLNG CF (GLOVE) ×3 IMPLANT
GOWN STRL REUS W/TWL XL LVL3 (GOWN DISPOSABLE) ×6 IMPLANT
HANDPIECE INTERPULSE COAX TIP (DISPOSABLE) ×2
HIP BALL CERAMIC (Hips) ×3 IMPLANT
HOLDER FOLEY CATH W/STRAP (MISCELLANEOUS) ×3 IMPLANT
KIT TURNOVER KIT A (KITS) IMPLANT
PACK ANTERIOR HIP CUSTOM (KITS) ×3 IMPLANT
PENCIL SMOKE EVACUATOR (MISCELLANEOUS) IMPLANT
PINN ALTRX NEUT ID X OD 32X48 ×2 IMPLANT
PINNSECTOR W/GRIP ACE CUP 48MM (Joint) ×3 IMPLANT
SET HNDPC FAN SPRY TIP SCT (DISPOSABLE) ×1 IMPLANT
STAPLER VISISTAT 35W (STAPLE) IMPLANT
STEM FEM SZ3 STD ACTIS (Stem) ×2 IMPLANT
STRIP CLOSURE SKIN 1/2X4 (GAUZE/BANDAGES/DRESSINGS) ×1 IMPLANT
SUT ETHIBOND NAB CT1 #1 30IN (SUTURE) ×5 IMPLANT
SUT ETHILON 2 0 PS N (SUTURE) IMPLANT
SUT MNCRL AB 4-0 PS2 18 (SUTURE) IMPLANT
SUT VIC AB 0 CT1 36 (SUTURE) ×3 IMPLANT
SUT VIC AB 1 CT1 36 (SUTURE) ×3 IMPLANT
SUT VIC AB 2-0 CT1 27 (SUTURE) ×4
SUT VIC AB 2-0 CT1 TAPERPNT 27 (SUTURE) ×2 IMPLANT
TRAY FOLEY MTR SLVR 16FR STAT (SET/KITS/TRAYS/PACK) IMPLANT
YANKAUER SUCT BULB TIP 10FT TU (MISCELLANEOUS) ×3 IMPLANT

## 2019-04-20 NOTE — H&P (Signed)
TOTAL HIP ADMISSION H&P  Patient is admitted for left total hip arthroplasty.  Subjective:  Chief Complaint: left hip pain  HPI: Daisy Ortiz, 23 y.o. female, has a history of pain and functional disability in the left hip(s) due to avascular necrosis and patient has failed non-surgical conservative treatments for greater than 12 weeks to include NSAID's and/or analgesics, flexibility and strengthening excercises, use of assistive devices and activity modification.  Onset of symptoms was abrupt starting 1 years ago with rapidlly worsening course since that time.The patient noted no past surgery on the left hip(s).  Patient currently rates pain in the left hip at 10 out of 10 with activity. Patient has night pain, worsening of pain with activity and weight bearing, trendelenberg gait, pain that interfers with activities of daily living and pain with passive range of motion. Patient has evidence of subchondral cysts and joint space narrowing by imaging studies. This condition presents safety issues increasing the risk of falls.  There is no current active infection.  Patient Active Problem List   Diagnosis Date Noted  . Avascular necrosis of bone of hip, left ([place]) 03/19/2019  . Unilateral primary osteoarthritis, right hip 02/02/2019  . Status post total replacement of right hip 02/02/2019   Past Medical History:  Diagnosis Date  . Anemia   . 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 Crohn's disease ([place])   . GAD (generalized anxiety disorder)   . Major depression   . Pneumonia    as a child  . Right sciatic nerve pain   . Ulcerative colitis [facility])     Past Surgical History:  Procedure Laterality Date  . COLONOSCOPY    . ESOPHAGOGASTRODUODENOSCOPY    . TONSILLECTOMY    . TOTAL HIP ARTHROPLASTY Right 02/02/2019   Procedure: RIGHT TOTAL HIP ARTHROPLASTY ANTERIOR APPROACH;  Surgeon: Mcarthur Rossetti, MD;  Location: [facility];  Service: Orthopedics;  Laterality: Right;    No current  facility-administered medications for this encounter.    Allergies  Allergen Reactions  . Infliximab Anaphylaxis, Shortness Of Breath, Nausea And Vomiting and Swelling    Facial Edema (intolerance)    Social History   Tobacco Use  . Smoking status: Never Smoker  . Smokeless tobacco: Never Used  Substance Use Topics  . Alcohol use: Yes    Frequency: Never    Comment: occasional    No family history on file.   Review of Systems  Musculoskeletal: Positive for joint pain.  All other systems reviewed and are negative.   Objective:  Physical Exam  Constitutional: She is oriented to person, place, and time. She appears well-developed and well-nourished.  HENT:  Head: Normocephalic and atraumatic.  Eyes: Pupils are equal, round, and reactive to light. EOM are normal.  Neck: Normal range of motion. Neck supple.  Cardiovascular: Normal rate.  Respiratory: Effort normal.  GI: Soft.  Musculoskeletal:     Left hip: She exhibits decreased range of motion, decreased strength, tenderness and bony tenderness.  Neurological: She is alert and oriented to person, place, and time.  Skin: Skin is warm and dry.  Psychiatric: She has a normal mood and affect.    Vital signs in last 24 hours:    Labs:   Estimated body mass index is 20.42 kg/m as calculated from the following:   Height as of 04/17/19: 5\' 5"  (1.651 m).   Weight as of 04/17/19: 55.7 kg.   Imaging Review Plain radiographs demonstrate severe AVN of the left hip(s).  The bone quality appears to be excellent for age and reported activity level.      Assessment/Plan:  End-stage avascular necrosis, left hip(s)  The patient history, physical examination, clinical judgement of the provider and imaging studies are consistent with end stage degenerative joint disease of the left hip(s) and total hip arthroplasty is deemed medically necessary. The treatment options including medical management, injection therapy, arthroscopy  and arthroplasty were discussed at length. The risks and benefits of total hip arthroplasty were presented and reviewed. The risks due to aseptic loosening, infection, stiffness, dislocation/subluxation,  thromboembolic complications and other imponderables were discussed.  The patient acknowledged the explanation, agreed to proceed with the plan and consent was signed. Patient is being admitted for inpatient treatment for surgery, pain control, PT, OT, prophylactic antibiotics, VTE prophylaxis, progressive ambulation and ADL's and discharge planning.The patient is planning to be discharged home with home health services    Patient's anticipated LOS is less than 2 midnights, meeting these requirements: - Younger than 81 - Lives within 1 hour of care - Has a competent adult at home to recover with post-op recover - NO history of  - Chronic pain requiring opiods  - Diabetes  - Coronary Artery Disease  - Heart failure  - Heart attack  - Stroke  - DVT/VTE  - Cardiac arrhythmia  - Respiratory Failure/COPD  - Renal failure  - Anemia  - Advanced Liver disease

## 2019-04-20 NOTE — Evaluation (Signed)
Physical Therapy Evaluation Patient Details Name: Daisy Ortiz MRN: [id number] DOB: Jan 17, 1996 Today's Date: 04/20/2019   History of Present Illness  Pt is 23 y.o. female s/p Lt THA anterior approach with PMH significnat for crohn's dieases, ulcerative colitis, and AVN of hips with Rt THA performedin 02/02/19.    Clinical Impression  Daisy Ortiz is a 23 y.o. female POD 0 s/p Lt THA anterior approach. Patient reports independence with mobility at baseline. Patient is now limited by functional impairments (see PT problem list below) and requires min assist/guard for transfers and gait with RW. Patient was able to ambulate ~100 feet with RW and min guard assist. Patient instructed in exercise to facilitate ROM and circulation. Patient will benefit from continued skilled PT interventions to address impairments and progress towards PLOF. Acute PT will follow to progress mobility and stair training in preparation for safe discharge home.    Follow Up Recommendations Follow surgeon's recommendation for DC plan and follow-up therapies    Equipment Recommendations  None recommended by PT    Recommendations for Other Services       Precautions / Restrictions Precautions Precautions: Fall Restrictions Weight Bearing Restrictions: No      Mobility  Bed Mobility Overal bed mobility: Needs Assistance Bed Mobility: Supine to Sit     Supine to sit: HOB elevated;Supervision        Transfers Overall transfer level: Needs assistance Equipment used: Rolling walker (2 wheeled) Transfers: Sit to/from Stand Sit to Stand: Min guard         General transfer comment: cues for safe hand placement, no assistance required to perform power up  Ambulation/Gait Ambulation/Gait assistance: Min guard Gait Distance (Feet): 100 Feet Assistive device: Rolling walker (2 wheeled) Gait Pattern/deviations: Step-through pattern;Decreased stride length Gait velocity: decreased   General Gait Details:  pt with good safety awareness for RW management; no cues needed for safe step pattern  Stairs            Wheelchair Mobility    Modified Rankin (Stroke Patients Only)       Balance Overall balance assessment: Needs assistance Sitting-balance support: No upper extremity supported;Feet supported Sitting balance-Leahy Scale: Good     Standing balance support: During functional activity;Bilateral upper extremity supported Standing balance-Leahy Scale: Fair            Pertinent Vitals/Pain Pain Assessment: 0-10 Pain Score: 6  Pain Location: Lt hip Pain Descriptors / Indicators: Aching;Sore Pain Intervention(s): Limited activity within patient's tolerance;Monitored during session;Patient requesting pain meds-RN notified;Ice applied;Repositioned    Home Living Family/patient expects to be discharged to:: Private residence Living Arrangements: Parent;Other relatives Available Help at Discharge: Family;Available 24 hours/day Type of Home: House Home Access: Stairs to enter Entrance Stairs-Rails: Psychiatric nurse of Steps: 4 Home Layout: Two level;1/2 bath on main level Home Equipment: Walker - 2 wheels;Bedside commode      Prior Function Level of Independence: Independent         Comments: pt is a Ship broker at [employer] and enjoys video games and Biochemist, clinical Dominance   Dominant Hand: Left    Extremity/Trunk Assessment   Upper Extremity Assessment Upper Extremity Assessment: Overall WFL for tasks assessed    Lower Extremity Assessment Lower Extremity Assessment: Overall WFL for tasks assessed;LLE deficits/detail LLE Deficits / Details: some tingling in buttocks remains, pt with good quad activation, 4/5 for MMT LLE Sensation: WNL LLE Coordination: WNL    Cervical / Trunk Assessment Cervical / Trunk  Assessment: Normal  Communication   Communication: No difficulties  Cognition Arousal/Alertness: Awake/alert Behavior During  Therapy: WFL for tasks assessed/performed Overall Cognitive Status: Within Functional Limits for tasks assessed         General Comments      Exercises Total Joint Exercises Ankle Circles/Pumps: AROM;10 reps;Seated;Both Heel Slides: AAROM;10 reps;Seated;Left Long Arc Quad: AROM;10 reps;Seated;Left   Assessment/Plan    PT Assessment Patient needs continued PT services  PT Problem List Decreased strength;Decreased balance;Decreased range of motion;Decreased mobility;Decreased coordination;Decreased activity tolerance;Decreased knowledge of use of DME       PT Treatment Interventions DME instruction;Functional mobility training;Therapeutic activities;Gait training;Stair training;Therapeutic exercise;Balance training;Patient/family education    PT Goals (Current goals can be found in the Care Plan section)  Acute Rehab PT Goals Patient Stated Goal: get back to independence PT Goal Formulation: With patient Time For Goal Achievement: 04/27/19 Potential to Achieve Goals: Good    Frequency 7X/week    AM-PAC PT "6 Clicks" Mobility  Outcome Measure Help needed turning from your back to your side while in a flat bed without using bedrails?: A Little Help needed moving from lying on your back to sitting on the side of a flat bed without using bedrails?: A Little Help needed moving to and from a bed to a chair (including a wheelchair)?: A Little Help needed standing up from a chair using your arms (e.g., wheelchair or bedside chair)?: A Little Help needed to walk in hospital room?: A Little Help needed climbing 3-5 steps with a railing? : A Little 6 Click Score: 18    End of Session Equipment Utilized During Treatment: Gait belt Activity Tolerance: Patient tolerated treatment well Patient left: in chair;with call bell/phone within reach;with family/visitor present;with chair alarm set Nurse Communication: Mobility status PT Visit Diagnosis: Muscle weakness (generalized)  (M62.81);Difficulty in walking, not elsewhere classified (R26.2)    Time: 1610-9604 PT Time Calculation (min) (ACUTE ONLY): 17 min   Charges:   PT Evaluation $PT Eval Low Complexity: 1 Low         Valentino Saxon, PT, DPT Physical Therapist with [facility]  04/20/2019 4:25 PM

## 2019-04-20 NOTE — Op Note (Signed)
NAME: Daisy Ortiz, Daisy Ortiz MEDICAL RECORD IO:[id number] ACCOUNT [id number] DATE OF BIRTH:[birth year] FACILITY: [facility] LOCATION: [facility] PHYSICIAN:Shanese Riemenschneider Aretha Parrot, MD  OPERATIVE REPORT  DATE OF PROCEDURE:  04/20/2019  PREOPERATIVE DIAGNOSIS:  End-stage avascular necrosis, left hip.  POSTOPERATIVE DIAGNOSIS:  End-stage avascular necrosis, left hip.  PROCEDURE:  Left total hip arthroplasty through direct anterior approach.  IMPLANTS:  DePuy Sector Gription acetabular component size 48, size 32+4 neutral polyethylene liner, size 3 Actis femoral component with standard offset, size 32+5 ceramic hip ball.  SURGEON:  Vanita Panda. Magnus Ivan, MD  ASSISTANT:  Richardean Canal, PA-C  ANESTHESIA:  Spinal.  ANTIBIOTICS:  Two grams IV Ancef.  ESTIMATED BLOOD LOSS:  300 mL.  COMPLICATIONS:  None.  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  She has a history of ulcerative colitis and has been on and off steroids since a very young age.  We actually replaced her right hip earlier this year due to  avascular necrosis.  She now presents to have her left hip replaced.  She has end-stage avascular necrosis of that left side as well.  Having had the surgery before she is fully aware of the risk of acute blood loss anemia, nerve or vessel injury,  fracture, infection, dislocation, DVT and implant failure.  She understands her goals are to decrease pain, improve mobility and overall improve quality of life.  DESCRIPTION OF PROCEDURE:  After informed consent was obtained appropriate left hip was marked.  She was brought to the operating room and sat up on a stretcher where spinal anesthesia was obtained.  She was then laid in supine position on a stretcher.   We assessed her leg lengths.  A Foley catheter was placed.  She is definitely shorter starting off on the left side than the right even though radiographic evidence does not suggest that clinically.   She actually shorter and she feels that as well.   Traction boots were placed on both her feet and she was placed supine on the Hana fracture table, the perineal post in place and both legs in line skeletal traction device and no traction applied.  Her left operative hip was prepped and draped with  DuraPrep and sterile drapes.  A time-out was called.  She was identified as correct patient, correct left hip.  We then made an incision just inferior and posterior to the anterior superior iliac spine and dissected down tensor fascia lata muscle.   Tensor fascia was then divided longitudinally to proceed with direct anterior approach to the hip.  I identified and cauterized circumflex vessels and identified the hip capsule, opened the hip capsule in an L-type format, finding moderate joint effusion  and placed a Cobra retractor around the medial and lateral femoral neck.  We made our femoral neck cut with an oscillating saw just proximal to the lesser trochanter and completed this with an osteotome.  We placed a corkscrew guide in the femoral head  and removed the femoral head in its entirety and there was a wide area where the cartilage was flaking off from avascular necrosis.  There was flattening of the head as well.  I sent it off to the back table.  We then placed a bent Hohmann over the  medial acetabular rim and removed remnants of the acetabular labrum and other debris.  We only reamed with 2 reamers from a size 44 reamer and then jumped to a 47 with all reamers under direct visualization, the last  reamer under direct fluoroscopy, so  we could obtain our depth in reaming our inclination and anteversion.  I then placed the real DePuy Sector Gription acetabular component size 48 and a 32+4 neutral polyethylene liner to correspond to her other side and her offset.  Attention was then  turned to the femur.  With the leg externally rotated to 120 degrees, extended and adducted, I was able to place a Mueller  retractor medially and Hohman retractor behind the greater trochanter.  We released lateral joint capsule and used a box-cutting  osteotome to enter the femoral canal and a rongeur to lateralize.  Then began broaching using the Actis broaching system from a size zero up to a size 3.  With the size 3 in place, we trialed a standard offset femoral neck and a 32 +1 hip ball, reduced  this in the acetabulum and I felt like we just needed to go a little bit more leg length and although she was stable.  We dislocated the hip and removed the trial components.  We placed the real Actis femoral component size 3 and the real 32+5 ceramic  hip ball.  Again reduced this in the acetabulum and I felt that we had increased her leg length like we wanted to and she was stable on exam and fluoroscopic assessment as well.  We then irrigated the soft tissue with normal saline solution using  pulsatile lavage.  We were able to close the joint capsule with interrupted #1 Ethibond suture, followed by closing the tensor fascia with #1 Vicryl.  0 Vicryl was used to close deep tissue, 2-0 Vicryl was used to close the subcutaneous tissue, and 4-0  Monocryl subcuticular stitch brought the skin together.  Steri-Strips were applied as well as an Aquacel dressing.  She was taken off the Hana table and taken to recovery room in stable condition.  All final counts were correct.  There were no complications noted.  Of note, Benita Stabile, PA-C, assisted the entire case.  His assistance was crucial for facilitating all  aspects of this case.  CN/NUANCE  D:04/20/2019 T:04/20/2019 JOB:[id number]

## 2019-04-20 NOTE — Transfer of Care (Signed)
Immediate Anesthesia Transfer of Care Note  Patient: Daisy Ortiz  Procedure(s) Performed: LEFT TOTAL HIP ARTHROPLASTY ANTERIOR APPROACH (Left Hip)  Patient Location: PACU  Anesthesia Type:Spinal  Level of Consciousness: drowsy, patient cooperative and responds to stimulation  Airway & Oxygen Therapy: Patient Spontanous Breathing and Patient connected to face mask oxygen  Post-op Assessment: Report given to RN and Post -op Vital signs reviewed and stable  Post vital signs: Reviewed and stable  Last Vitals:  Vitals Value Taken Time  BP    Temp    Pulse    Resp    SpO2      Last Pain:  Vitals:   04/20/19 0742  TempSrc: Oral         Complications: No apparent anesthesia complications

## 2019-04-20 NOTE — Anesthesia Postprocedure Evaluation (Signed)
Anesthesia Post Note  Patient: Daisy Ortiz  Procedure(s) Performed: LEFT TOTAL HIP ARTHROPLASTY ANTERIOR APPROACH (Left Hip)     Patient location during evaluation: PACU Anesthesia Type: Spinal Level of consciousness: oriented and awake and alert Pain management: pain level controlled Vital Signs Assessment: post-procedure vital signs reviewed and stable Respiratory status: spontaneous breathing and respiratory function stable Cardiovascular status: blood pressure returned to baseline and stable Postop Assessment: no headache, no backache, no apparent nausea or vomiting and patient able to bend at knees Anesthetic complications: no    Last Vitals:  Vitals:   04/20/19 1230 04/20/19 1304  BP: 125/78 96/64  Pulse: (!) 56 (!) 54  Resp: 15 15  Temp: 36.6 C 36.6 C  SpO2: 100% 96%    Last Pain:  Vitals:   04/20/19 1304  TempSrc: Oral  PainSc: [place]

## 2019-04-20 NOTE — Anesthesia Preprocedure Evaluation (Signed)
Anesthesia Evaluation  Patient identified by MRN, date of birth, ID band Patient awake    Reviewed: Allergy & Precautions, NPO status , Patient's Chart, lab work & pertinent test results  Airway Mallampati: I  TM Distance: >3 FB Neck ROM: Full    Dental no notable dental hx. (+) Teeth Intact, Dental Advisory Given   Pulmonary neg pulmonary ROS,    Pulmonary exam normal breath sounds clear to auscultation       Cardiovascular negative cardio ROS Normal cardiovascular exam Rhythm:Regular Rate:Normal     Neuro/Psych PSYCHIATRIC DISORDERS Anxiety Depression negative neurological ROS     GI/Hepatic Neg liver ROS, PUD, Crohn's/UC- in remission   Endo/Other  negative endocrine ROS  Renal/GU negative Renal ROS  negative genitourinary   Musculoskeletal  (+) Arthritis , Osteoarthritis,  Avascular necrosis B/L hips, s/p right hip replacement 01/2019- did well with spinal anesthetic   Abdominal Normal abdominal exam  (+)   Peds negative pediatric ROS (+)  Hematology negative hematology ROS (+) Hx anemia, hct 42   Anesthesia Other Findings   Reproductive/Obstetrics negative OB ROS                             Anesthesia Physical  Anesthesia Plan  ASA: I  Anesthesia Plan: Spinal   Post-op Pain Management:    Induction:   PONV Risk Score and Plan: 2 and Treatment may vary due to age or medical condition  Airway Management Planned: Nasal Cannula and Simple Face Mask  Additional Equipment: None  Intra-op Plan:   Post-operative Plan:   Informed Consent: I have reviewed the patients History and Physical, chart, labs and discussed the procedure including the risks, benefits and alternatives for the proposed anesthesia with the patient or authorized representative who has indicated his/her understanding and acceptance.     Dental advisory given  Plan Discussed with: CRNA  Anesthesia Plan  Comments:         Anesthesia Quick Evaluation

## 2019-04-20 NOTE — Anesthesia Procedure Notes (Addendum)
Spinal  Patient location during procedure: OR Start time: 04/20/2019 9:59 AM End time: 04/20/2019 10:11 AM Staffing Resident/CRNA: ,  E, CRNA Performed: resident/CRNA  Preanesthetic Checklist Completed: patient identified, site marked, surgical consent, pre-op evaluation, timeout performed, IV checked, risks and benefits discussed and monitors and equipment checked Spinal Block Patient position: sitting Prep: DuraPrep Patient monitoring: heart rate, cardiac monitor, continuous pulse ox and blood pressure Approach: midline Location: L3-4 Injection technique: single-shot Needle Needle type: Sprotte  Needle gauge: 24 G Needle length: 9 cm Assessment Sensory level: T4 Additional Notes IV functioning, monitors applied to pt. Expiration date of kit checked and confirmed to be in date. Sterile prep and drape, hand hygiene and sterile gloved used. Pt was positioned and spine was prepped in sterile fashion. Skin was anesthetized with lidocaine. Free flow of clear CSF obtained prior to injecting local anesthetic into CSF x 1 attempt. Spinal needle aspirated freely following injection. Needle was carefully withdrawn, and pt tolerated procedure well. Loss of motor and sensory on exam post injection.      

## 2019-04-20 NOTE — Brief Op Note (Signed)
04/20/2019  11:24 AM  PATIENT:  Daisy Ortiz  23 y.o. female  PRE-OPERATIVE DIAGNOSIS:  avascular necrosis left hip  POST-OPERATIVE DIAGNOSIS:  avascular necrosis left hip  PROCEDURE:  Procedure(s): LEFT TOTAL HIP ARTHROPLASTY ANTERIOR APPROACH (Left)  SURGEON:  Surgeon(s) and Role:    Mcarthur Rossetti, MD - Primary  PHYSICIAN ASSISTANT:  Benita Stabile, PA-C  ANESTHESIA:   spinal  EBL:  250 mL   COUNTS:  YES  DICTATION: .Other Dictation: Dictation Number [id number]  PLAN OF CARE: Admit to inpatient   PATIENT DISPOSITION:  PACU - hemodynamically stable.   Delay start of Pharmacological VTE agent (>24hrs) due to surgical blood loss or risk of bleeding: no

## 2019-04-21 DIAGNOSIS — D62 Acute posthemorrhagic anemia: Secondary | ICD-10-CM | POA: Diagnosis not present

## 2019-04-21 LAB — CBC WITH DIFFERENTIAL/PLATELET
Abs Immature Granulocytes: 0.05 10*3/uL (ref 0.00–0.07)
Basophils Absolute: 0 10*3/uL (ref 0.0–0.1)
Basophils Relative: 0 %
Eosinophils Absolute: 0 10*3/uL (ref 0.0–0.5)
Eosinophils Relative: 0 %
HCT: 28.9 % — ABNORMAL LOW (ref 36.0–46.0)
Hemoglobin: 8.7 g/dL — ABNORMAL LOW (ref 12.0–15.0)
Immature Granulocytes: 0 %
Lymphocytes Relative: 20 %
Lymphs Abs: 2.3 10*3/uL (ref 0.7–4.0)
MCH: 25.1 pg — ABNORMAL LOW (ref 26.0–34.0)
MCHC: 30.1 g/dL (ref 30.0–36.0)
MCV: 83.5 fL (ref 80.0–100.0)
Monocytes Absolute: 1.1 10*3/uL — ABNORMAL HIGH (ref 0.1–1.0)
Monocytes Relative: 10 %
Neutro Abs: 7.9 10*3/uL — ABNORMAL HIGH (ref 1.7–7.7)
Neutrophils Relative %: 70 %
Platelets: 201 10*3/uL (ref 150–400)
RBC: 3.46 MIL/uL — ABNORMAL LOW (ref 3.87–5.11)
RDW: 13.5 % (ref 11.5–15.5)
WBC: 11.3 10*3/uL — ABNORMAL HIGH (ref 4.0–10.5)
nRBC: 0 % (ref 0.0–0.2)

## 2019-04-21 LAB — CBC
HCT: 29.4 % — ABNORMAL LOW (ref 36.0–46.0)
Hemoglobin: 8.9 g/dL — ABNORMAL LOW (ref 12.0–15.0)
MCH: 25.4 pg — ABNORMAL LOW (ref 26.0–34.0)
MCHC: 30.3 g/dL (ref 30.0–36.0)
MCV: 84 fL (ref 80.0–100.0)
Platelets: 214 10*3/uL (ref 150–400)
RBC: 3.5 MIL/uL — ABNORMAL LOW (ref 3.87–5.11)
RDW: 13.4 % (ref 11.5–15.5)
WBC: 11.8 10*3/uL — ABNORMAL HIGH (ref 4.0–10.5)
nRBC: 0 % (ref 0.0–0.2)

## 2019-04-21 LAB — BASIC METABOLIC PANEL
Anion gap: 10 (ref 5–15)
BUN: <5 mg/dL — ABNORMAL LOW (ref 6–20)
CO2: 21 mmol/L — ABNORMAL LOW (ref 22–32)
Calcium: 8.8 mg/dL — ABNORMAL LOW (ref 8.9–10.3)
Chloride: 106 mmol/L (ref 98–111)
Creatinine, Ser: 0.48 mg/dL (ref 0.44–1.00)
GFR calc Af Amer: >60 mL/min (ref >60)
GFR calc non Af Amer: >60 mL/min (ref >60)
Glucose, Bld: 117 mg/dL — ABNORMAL HIGH (ref 70–99)
Potassium: 3.8 mmol/L (ref 3.5–5.1)
Sodium: 137 mmol/L (ref 135–145)

## 2019-04-21 MED ORDER — METHOCARBAMOL 500 MG PO TABS: 500.0000 mg | ORAL_TABLET | Freq: Three times a day (TID) | ORAL | 1 refills | Status: AC | PRN

## 2019-04-21 MED ORDER — ASPIRIN 81 MG PO CHEW: 81.0000 mg | CHEWABLE_TABLET | Freq: Two times a day (BID) | ORAL | 1 refills | Status: AC

## 2019-04-21 MED ORDER — DOCUSATE SODIUM 100 MG PO CAPS: 100.0000 mg | ORAL_CAPSULE | Freq: Two times a day (BID) | ORAL | 0 refills | Status: AC

## 2019-04-21 MED ORDER — OXYCODONE HCL 5 MG PO TABS: 5.0000 mg | ORAL_TABLET | ORAL | 0 refills | Status: DC | PRN

## 2019-04-21 NOTE — Progress Notes (Signed)
Physical Therapy Treatment Patient Details Name: Daisy Ortiz MRN: [id number] DOB: 04/17/1996 Today's Date: 04/21/2019    History of Present Illness Pt is 23 y.o. female s/p Lt THA anterior approach with PMH significnat for crohn's dieases, ulcerative colitis, and AVN of hips with Rt THA performedin 02/02/19.    PT Comments    Pt has met PT goals and is ready to DC home, from PT standpoint. She ambulated 220' with RW, completed stair training, and demonstrates good understanding of HEP.   Follow Up Recommendations  Follow surgeon's recommendation for DC plan and follow-up therapies     Equipment Recommendations  None recommended by PT    Recommendations for Other Services       Precautions / Restrictions Precautions Precautions: Fall Restrictions Weight Bearing Restrictions: No    Mobility  Bed Mobility Overal bed mobility: Modified Independent Bed Mobility: Supine to Sit;Sit to Supine     Supine to sit: Modified independent (Device/Increase time) Sit to supine: Modified independent (Device/Increase time)      Transfers Overall transfer level: Needs assistance Equipment used: Rolling walker (2 wheeled) Transfers: Sit to/from Stand Sit to Stand: Modified independent (Device/Increase time)         General transfer comment: cues for safe hand placement, no assistance required to perform power up  Ambulation/Gait Ambulation/Gait assistance: Supervision Gait Distance (Feet): 220 Feet Assistive device: Rolling walker (2 wheeled) Gait Pattern/deviations: Step-through pattern;Decreased stride length Gait velocity: decreased   General Gait Details: pt with good safety awareness for RW management; no cues needed for safe step pattern   Stairs Stairs: Yes Stairs assistance: Supervision Stair Management: Two rails;Forwards;Step to pattern Number of Stairs: 3 General stair comments: 3 steps x 2 trials   Wheelchair Mobility    Modified Rankin (Stroke Patients  Only)       Balance Overall balance assessment: Needs assistance Sitting-balance support: No upper extremity supported;Feet supported Sitting balance-Leahy Scale: Good     Standing balance support: During functional activity;Bilateral upper extremity supported Standing balance-Leahy Scale: Fair                              Cognition Arousal/Alertness: Awake/alert Behavior During Therapy: WFL for tasks assessed/performed Overall Cognitive Status: Within Functional Limits for tasks assessed                                        Exercises Total Joint Exercises Ankle Circles/Pumps: AROM;10 reps;Seated;Both Quad Sets: AROM;Left;5 reps;Supine Short Arc Quad: AROM;Left;10 reps;Supine Heel Slides: AAROM;10 reps;Seated;Left Hip ABduction/ADduction: AROM;Left;10 reps;Supine Long Arc Quad: AROM;10 reps;Seated;Left    General Comments        Pertinent Vitals/Pain Pain Score: 4  Pain Location: L hip Pain Descriptors / Indicators: Sore Pain Intervention(s): Limited activity within patient's tolerance;Monitored during session;Premedicated before session;Ice applied    Home Living                      Prior Function            PT Goals (current goals can now be found in the care plan section) Acute Rehab PT Goals Patient Stated Goal: get back to independence PT Goal Formulation: With patient Time For Goal Achievement: 04/27/19 Potential to Achieve Goals: Good Progress towards PT goals: Goals met/education completed, patient discharged from PT    Frequency      7X/week      PT Plan Current plan remains appropriate    Co-evaluation              AM-PAC PT "6 Clicks" Mobility   Outcome Measure  Help needed turning from your back to your side while in a flat bed without using bedrails?: A Little Help needed moving from lying on your back to sitting on the side of a flat bed without using bedrails?: A Little Help needed moving  to and from a bed to a chair (including a wheelchair)?: None Help needed standing up from a chair using your arms (e.g., wheelchair or bedside chair)?: None Help needed to walk in hospital room?: None Help needed climbing 3-5 steps with a railing? : None 6 Click Score: 22    End of Session Equipment Utilized During Treatment: Gait belt Activity Tolerance: Patient tolerated treatment well Patient left: with call bell/phone within reach;in bed Nurse Communication: Mobility status PT Visit Diagnosis: Muscle weakness (generalized) (M62.81);Difficulty in walking, not elsewhere classified (R26.2)     Time: 2023-3435 PT Time Calculation (min) (ACUTE ONLY): 18 min  Charges:  $Gait Training: 8-22 mins                    Blondell Reveal Kistler PT 04/21/2019  Acute Rehabilitation Services Pager [phone] Office [phone]

## 2019-04-21 NOTE — Discharge Instructions (Signed)
Keep hip incision dry for 5 days post op then may wet while bathing. °Therapy daily . °Call if fever or chills or increased drainage. °Go to ER if acutely short of breath or call for ambulance. °Return for follow up in 2 weeks. °May full weight bear on the surgical leg unless told otherwise. °In house walking for first 2 weeks.  ° °

## 2019-04-21 NOTE — Discharge Summary (Signed)
Patient ID: Daisy Ortiz MRN: [id number] DOB/AGE: 08-16-1995 23 y.o.  Admit date: 04/20/2019 Discharge date: 04/21/2019  Admission Diagnoses:  Principal Problem:   Avascular necrosis of bone of hip, left (HCC) Active Problems:   Status post total replacement of left hip   Acute blood loss as cause of postoperative anemia   Discharge Diagnoses:  Same  Past Medical History:  Diagnosis Date  . Anemia   . 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 Crohn's disease (HCC)   . GAD (generalized anxiety disorder)   . Major depression   . Pneumonia    as a child  . Right sciatic nerve pain   . Ulcerative colitis (HCC)     Surgeries: Procedure(s): LEFT TOTAL HIP ARTHROPLASTY ANTERIOR APPROACH on 04/20/2019   Consultants:   Discharged Condition: Improved  Hospital Course: Daisy Ortiz is an 23 y.o. female who was admitted 04/20/2019 for operative treatment ofAvascular necrosis of bone of hip, left (HCC). Patient has severe unremitting pain that affects sleep, daily activities, and work/hobbies. After pre-op clearance the patient was taken to the operating room on 04/20/2019 and underwent  Procedure(s): LEFT TOTAL HIP ARTHROPLASTY ANTERIOR APPROACH.    Patient was given perioperative antibiotics:  Anti-infectives (From admission, onward)   Start     Dose/Rate Route Frequency Ordered Stop   04/20/19 1600  ceFAZolin (ANCEF) IVPB 1 g/50 mL premix     1 g 100 mL/hr over 30 Minutes Intravenous Every 6 hours 04/20/19 1259 04/20/19 2209   04/20/19 0730  ceFAZolin (ANCEF) IVPB 2g/100 mL premix     2 g 200 mL/hr over 30 Minutes Intravenous On call to O.R. 04/20/19 0724 04/20/19 1009   04/20/19 0730  ceFAZolin (ANCEF) 2-4 GM/100ML-% IVPB    Note to Pharmacy: Viviano Simas   : cabinet override      04/20/19 0730 04/20/19 0959       Patient was given sequential compression devices, early ambulation, and chemoprophylaxis to prevent DVT.  Patient benefited maximally from hospital stay and  there were no complications.    Recent vital signs:  Patient Vitals for the past 24 hrs:  BP Temp Temp src Pulse Resp SpO2 Height Weight  04/21/19 1006 110/70 97.9 F (36.6 C) - 80 16 100 % - -  04/21/19 0448 98/64 98.1 F (36.7 C) Oral (!) 54 18 96 % - -  04/21/19 0103 100/70 98 F (36.7 C) Oral (!) 56 18 100 % - -  04/20/19 2034 102/63 98.2 F (36.8 C) Oral (!) 52 14 100 % - -  04/20/19 1630 110/60 97.9 F (36.6 C) Oral 60 16 100 % - -  04/20/19 1532 104/63 97.7 F (36.5 C) - (!) 53 16 100 % - -  04/20/19 1437 (!) 135/57 98.1 F (36.7 C) Oral 71 20 94 % - -  04/20/19 1417 96/60 97.9 F (36.6 C) - (!) 57 16 100 % - -  04/20/19 1304 96/64 97.8 F (36.6 C) Oral (!) 54 15 96 % 5\' 5"  (1.651 m) 55.7 kg     Recent laboratory studies:  Recent Labs    04/21/19 0244 04/21/19 1005  WBC 11.8* 11.3*  HGB 8.9* 8.7*  HCT 29.4* 28.9*  PLT 214 201  NA 137  --   K 3.8  --   CL 106  --   CO2 21*  --   BUN <5*  --   CREATININE 0.48  --   GLUCOSE 117*  --  CALCIUM 8.8*  --      Discharge Medications:   Allergies as of 04/21/2019      Reactions   Infliximab Anaphylaxis, Shortness Of Breath, Nausea And Vomiting, Swelling   Facial Edema (intolerance)      Medication List    TAKE these medications   acetaminophen 500 MG tablet Commonly known as: TYLENOL Take 500 mg by mouth 3 (three) times daily as needed for moderate pain or headache.   aspirin 81 MG chewable tablet Chew 1 tablet (81 mg total) by mouth 2 (two) times daily.   docusate sodium 100 MG capsule Commonly known as: COLACE Take 1 capsule (100 mg total) by mouth 2 (two) times daily.   Entyvio 300 MG injection Generic drug: vedolizumab Inject 300 mg as directed every 6 (six) weeks.   escitalopram 5 MG tablet Commonly known as: LEXAPRO Take 5 mg by mouth daily.   IRON PO Take 1 tablet by mouth daily.   methocarbamol 500 MG tablet Commonly known as: ROBAXIN Take 1 tablet (500 mg total) by mouth every 8  (eight) hours as needed for muscle spasms.   oxyCODONE 5 MG immediate release tablet Commonly known as: Oxy IR/ROXICODONE Take 1-2 tablets (5-10 mg total) by mouth every 4 (four) hours as needed for moderate pain (pain score 4-6).            Durable Medical Equipment  (From admission, onward)         Start     Ordered   04/20/19 1300  DME 3 n 1  Once     04/20/19 1259   04/20/19 1300  DME Walker rolling  Once    Question:  Patient needs a walker to treat with the following condition  Answer:  Status post total replacement of left hip   04/20/19 1259          Diagnostic Studies: Dg Pelvis Portable  Result Date: 04/20/2019 CLINICAL DATA:  Postop left hip EXAM: PORTABLE PELVIS 1-2 VIEWS COMPARISON:  Radiograph same day FINDINGS: The patient is status post left total hip arthroplasty. No periprosthetic fracture seen. Overlying subcutaneous emphysema and soft tissue swelling. Again noted is a right-sided total hip arthroplasty. IMPRESSION: Status post left total hip arthroplasty without complication. Electronically Signed   By: Jonna Clark M.D.   On: 04/20/2019 12:32   Dg C-arm 1-60 Min-no Report  Result Date: 04/20/2019 CLINICAL DATA:  Status post left hip replacement. EXAM: OPERATIVE left HIP (WITH PELVIS IF PERFORMED) 2 VIEWS TECHNIQUE: Fluoroscopic spot image(s) were submitted for interpretation post-operatively. Radiation exposure index: 1.6256 mGy. COMPARISON:  February 02, 2019. FINDINGS: Two fluoroscopic images were obtained of the left hip. The left acetabular and femoral components appear to be well situated. Expected postoperative changes are seen in the surrounding soft tissues. IMPRESSION: Fluoroscopic guidance provided during left total hip arthroplasty. Electronically Signed   By: Lupita Raider M.D.   On: 04/20/2019 11:30   Dg Hip Operative Unilat With Pelvis Left  Result Date: 04/20/2019 CLINICAL DATA:  Status post left hip replacement. EXAM: OPERATIVE left HIP  (WITH PELVIS IF PERFORMED) 2 VIEWS TECHNIQUE: Fluoroscopic spot image(s) were submitted for interpretation post-operatively. Radiation exposure index: 1.6256 mGy. COMPARISON:  February 02, 2019. FINDINGS: Two fluoroscopic images were obtained of the left hip. The left acetabular and femoral components appear to be well situated. Expected postoperative changes are seen in the surrounding soft tissues. IMPRESSION: Fluoroscopic guidance provided during left total hip arthroplasty. Electronically Signed   By: Fayrene Fearing  Christen Butter M.D.   On: 04/20/2019 11:30    Disposition: Discharge disposition: 01-Home or Self Care       Discharge Instructions    Call MD / Call 911   Complete by: As directed    If you experience chest pain or shortness of breath, CALL 911 and be transported to the hospital emergency room.  If you develope a fever above 101 F, pus (white drainage) or increased drainage or redness at the wound, or calf pain, call your surgeon's office.   Constipation Prevention   Complete by: As directed    Drink plenty of fluids.  Prune juice may be helpful.  You may use a stool softener, such as Colace (over the counter) 100 mg twice a day.  Use MiraLax (over the counter) for constipation as needed.   Diet - low sodium heart healthy   Complete by: As directed    Discharge instructions   Complete by: As directed    INSTRUCTIONS AFTER JOINT REPLACEMENT   o Remove items at home which could result in a fall. This includes throw rugs or furniture in walking pathways o ICE to the affected joint every three hours while awake for 30 minutes at a time, for at least the first 3-5 days, and then as needed for pain and swelling.  Continue to use ice for pain and swelling. You may notice swelling that will progress down to the foot and ankle.  This is normal after surgery.  Elevate your leg when you are not up walking on it.   o Continue to use the breathing machine you got in the hospital (incentive spirometer)  which will help keep your temperature down.  It is common for your temperature to cycle up and down following surgery, especially at night when you are not up moving around and exerting yourself.  The breathing machine keeps your lungs expanded and your temperature down.   DIET:  As you were doing prior to hospitalization, we recommend a well-balanced diet.  DRESSING / WOUND CARE / SHOWERING  Keep the surgical dressing until follow up.  The dressing is water proof, so you can shower without any extra covering.  IF THE DRESSING FALLS OFF or the wound gets wet inside, change the dressing with sterile gauze.  Please use good hand washing techniques before changing the dressing.  Do not use any lotions or creams on the incision until instructed by your surgeon.    ACTIVITY  o Increase activity slowly as tolerated, but follow the weight bearing instructions below.   o No driving for 6 weeks or until further direction given by your physician.  You cannot drive while taking narcotics.  o No lifting or carrying greater than 10 lbs. until further directed by your surgeon. o Avoid periods of inactivity such as sitting longer than an hour when not asleep. This helps prevent blood clots.  o You may return to work once you are authorized by your doctor.     WEIGHT BEARING   Weight bearing as tolerated with assist device (walker, cane, etc) as directed, use it as long as suggested by your surgeon or therapist, typically at least 4-6 weeks.   EXERCISES  Results after joint replacement surgery are often greatly improved when you follow the exercise, range of motion and muscle strengthening exercises prescribed by your doctor. Safety measures are also important to protect the joint from further injury. Any time any of these exercises cause you to have increased pain or  swelling, decrease what you are doing until you are comfortable again and then slowly increase them. If you have problems or questions, call  your caregiver or physical therapist for advice.   Rehabilitation is important following a joint replacement. After just a few days of immobilization, the muscles of the leg can become weakened and shrink (atrophy).  These exercises are designed to build up the tone and strength of the thigh and leg muscles and to improve motion. Often times heat used for twenty to thirty minutes before working out will loosen up your tissues and help with improving the range of motion but do not use heat for the first two weeks following surgery (sometimes heat can increase post-operative swelling).   These exercises can be done on a training (exercise) mat, on the floor, on a table or on a bed. Use whatever works the best and is most comfortable for you.    Use music or television while you are exercising so that the exercises are a pleasant break in your day. This will make your life better with the exercises acting as a break in your routine that you can look forward to.   Perform all exercises about fifteen times, three times per day or as directed.  You should exercise both the operative leg and the other leg as well.   Exercises include:    Quad Sets - Tighten up the muscle on the front of the thigh (Quad) and hold for 5-10 seconds.    Straight Leg Raises - With your knee straight (if you were given a brace, keep it on), lift the leg to 60 degrees, hold for 3 seconds, and slowly lower the leg.  Perform this exercise against resistance later as your leg gets stronger.   Leg Slides: Lying on your back, slowly slide your foot toward your buttocks, bending your knee up off the floor (only go as far as is comfortable). Then slowly slide your foot back down until your leg is flat on the floor again.   Angel Wings: Lying on your back spread your legs to the side as far apart as you can without causing discomfort.   Hamstring Strength:  Lying on your back, push your heel against the floor with your leg straight by  tightening up the muscles of your buttocks.  Repeat, but this time bend your knee to a comfortable angle, and push your heel against the floor.  You may put a pillow under the heel to make it more comfortable if necessary.   A rehabilitation program following joint replacement surgery can speed recovery and prevent re-injury in the future due to weakened muscles. Contact your doctor or a physical therapist for more information on knee rehabilitation.    CONSTIPATION  Constipation is defined medically as fewer than three stools per week and severe constipation as less than one stool per week.  Even if you have a regular bowel pattern at home, your normal regimen is likely to be disrupted due to multiple reasons following surgery.  Combination of anesthesia, postoperative narcotics, change in appetite and fluid intake all can affect your bowels.   YOU MUST use at least one of the following options; they are listed in order of increasing strength to get the job done.  They are all available over the counter, and you may need to use some, POSSIBLY even all of these options:    Drink plenty of fluids (prune juice may be helpful) and high fiber foods Colace 100  mg by mouth twice a day  Senokot for constipation as directed and as needed Dulcolax (bisacodyl), take with full glass of water  Miralax (polyethylene glycol) once or twice a day as needed.  If you have tried all these things and are unable to have a bowel movement in the first 3-4 days after surgery call either your surgeon or your primary doctor.    If you experience loose stools or diarrhea, hold the medications until you stool forms back up.  If your symptoms do not get better within 1 week or if they get worse, check with your doctor.  If you experience "the worst abdominal pain ever" or develop nausea or vomiting, please contact the office immediately for further recommendations for treatment.   ITCHING:  If you experience itching with  your medications, try taking only a single pain pill, or even half a pain pill at a time.  You can also use Benadryl over the counter for itching or also to help with sleep.   TED HOSE STOCKINGS:  Use stockings on both legs until for at least 2 weeks or as directed by physician office. They may be removed at night for sleeping.  MEDICATIONS:  See your medication summary on the "After Visit Summary" that nursing will review with you.  You may have some home medications which will be placed on hold until you complete the course of blood thinner medication.  It is important for you to complete the blood thinner medication as prescribed.  PRECAUTIONS:  If you experience chest pain or shortness of breath - call 911 immediately for transfer to the hospital emergency department.   If you develop a fever greater that 101 F, purulent drainage from wound, increased redness or drainage from wound, foul odor from the wound/dressing, or calf pain - CONTACT YOUR SURGEON.                                                   FOLLOW-UP APPOINTMENTS:  If you do not already have a post-op appointment, please call the office for an appointment to be seen by your surgeon.  Guidelines for how soon to be seen are listed in your "After Visit Summary", but are typically between 1-4 weeks after surgery.  OTHER INSTRUCTIONS:   Knee Replacement:  Do not place pillow under knee, focus on keeping the knee straight while resting. CPM instructions: 0-90 degrees, 2 hours in the morning, 2 hours in the afternoon, and 2 hours in the evening. Place foam block, curve side up under heel at all times except when in CPM or when walking.  DO NOT modify, tear, cut, or change the foam block in any way.  MAKE SURE YOU:   Understand these instructions.   Get help right away if you are not doing well or get worse.    Thank you for letting us be a part of your medical care team.  It is a privilege we respect greatly.  We hope these  instructions will help you stay on track for a fast and full recovery!   Driving restrictions   Complete by: As directed    No driving for 3 weeks   Increase activity slowly as tolerated   Complete by: As directed    Lifting restrictions   Complete by: As directed    No lifting for  8 weeks      Follow-up Information    Kathryne Hitch,  Y, MD Follow up in 2 week(s).   Specialty: Orthopedic Surgery Contact information: [address] [place] [address] [phone]            Signed: Kathryne Hitch Y  04/21/2019, 12:37 PM

## 2019-04-21 NOTE — TOC Progression Note (Signed)
Transition of Care [facility]) - Progression Note    Patient Details  Name: Daisy Ortiz MRN: [id number] Date of Birth: 06/25/1995  [facility] CM/SW Contact  Joaquin Courts, RN Phone Number: 04/21/2019, 3:12 PM  Clinical Narrative:    CM spoke with patient at bedside. CM made referral to [facility] for HHPT need (case # [id number]), all requested clinicals faxed over. Patient reports has rolling walker and 3-in-1 at home.    Expected Discharge Plan: [place] Barriers to Discharge: No Barriers Identified  Expected Discharge Plan and Services Expected Discharge Plan: [place]   Discharge Planning Services: CM Consult   Living arrangements for the past 2 months: Single Family Home Expected Discharge Date: 04/21/19               DME Arranged: N/A DME Agency: NA       HH Arranged: PT HH Agency: Other - See comment(referral to [facility] for HHPT ([employer] Patient))         Social Determinants of Health (SDOH) Interventions    Readmission Risk Interventions No flowsheet data found.

## 2019-04-21 NOTE — Progress Notes (Signed)
     Subjective: 1 Day Post-Op Procedure(s) (LRB): LEFT TOTAL HIP ARTHROPLASTY ANTERIOR APPROACH (Left) Awake, alert and oriented x 4. History of anemia, HGb 9.6 in 01/2019. Preop 14 hgb. Post op decreased. Pain levels are controlled. Voiding normally.   Patient reports pain as mild.    Objective:   VITALS:  Temp:  [97.7 F (36.5 C)-98.2 F (36.8 C)] 98.1 F (36.7 C) (11/21 0448) Pulse Rate:  [52-71] 54 (11/21 0448) Resp:  [14-20] 18 (11/21 0448) BP: (93-135)/(54-78) 98/64 (11/21 0448) SpO2:  [94 %-100 %] 96 % (11/21 0448) Weight:  [55.7 kg] 55.7 kg (11/20 1304)  Neurologically intact ABD soft Neurovascular intact Sensation intact distally Intact pulses distally Dorsiflexion/Plantar flexion intact Incision: dressing C/D/I and no drainage   LABS Recent Labs    04/21/19 0244  HGB 8.9*  WBC 11.8*  PLT 214   Recent Labs    04/21/19 0244  NA 137  K 3.8  CL 106  CO2 21*  BUN <5*  CREATININE 0.48  GLUCOSE 117*   No results for input(s): LABPT, INR in the last 72 hours.   Assessment/Plan: 1 Day Post-Op Procedure(s) (LRB): LEFT TOTAL HIP ARTHROPLASTY ANTERIOR APPROACH (Left)  Advance diet Up with therapy D/C IV fluids Discharge home with home health  Check CBC, if stable discharge home on iron.  Basil Dess 04/21/2019, 9:48 AMPatient ID: Daisy Ortiz, female   DOB: March 28, 1996, 23 y.o.   MRN: [id number]

## 2019-04-23 ENCOUNTER — Encounter (HOSPITAL_COMMUNITY): Payer: Self-pay | Admitting: Orthopaedic Surgery

## 2019-04-23 ENCOUNTER — Telehealth: Payer: Self-pay | Admitting: Orthopaedic Surgery

## 2019-04-23 ENCOUNTER — Other Ambulatory Visit: Payer: Self-pay | Admitting: Orthopaedic Surgery

## 2019-04-23 MED ORDER — ONDANSETRON 4 MG PO TBDP: 4.0000 mg | ORAL_TABLET | Freq: Three times a day (TID) | ORAL | 0 refills | Status: AC | PRN

## 2019-04-23 NOTE — Telephone Encounter (Signed)
Dr. Ninfa Linden sent her a message via [facility]

## 2019-04-23 NOTE — Telephone Encounter (Signed)
Patient's mother left a voicemail message stating the patient had surgery and is now vomiting and unable to keep anything down.  CB#[phone].  Thank you.

## 2019-04-24 ENCOUNTER — Other Ambulatory Visit: Payer: Self-pay | Admitting: Orthopaedic Surgery

## 2019-04-24 MED ORDER — PROMETHAZINE HCL 12.5 MG PO TABS: 12.5000 mg | ORAL_TABLET | Freq: Four times a day (QID) | ORAL | 0 refills | Status: AC | PRN

## 2019-04-25 ENCOUNTER — Encounter: Payer: Self-pay | Admitting: Orthopaedic Surgery

## 2019-04-25 ENCOUNTER — Other Ambulatory Visit: Payer: Self-pay | Admitting: Orthopaedic Surgery

## 2019-04-25 MED ORDER — OXYCODONE HCL 5 MG PO TABS: 5.0000 mg | ORAL_TABLET | ORAL | 0 refills | Status: DC | PRN

## 2019-04-30 ENCOUNTER — Other Ambulatory Visit: Payer: Self-pay | Admitting: Orthopaedic Surgery

## 2019-04-30 ENCOUNTER — Encounter: Payer: Self-pay | Admitting: Orthopaedic Surgery

## 2019-04-30 MED ORDER — OXYCODONE HCL 5 MG PO TABS: 5.0000 mg | ORAL_TABLET | ORAL | 0 refills | Status: DC | PRN

## 2019-05-02 ENCOUNTER — Encounter: Payer: Self-pay | Admitting: Orthopaedic Surgery

## 2019-05-04 ENCOUNTER — Telehealth: Payer: Self-pay | Admitting: Orthopaedic Surgery

## 2019-05-04 NOTE — Telephone Encounter (Signed)
Daisy Ortiz called in regards to [facility] about surgery follow up appointment. Left message on voicemail. Daisy Ortiz phone number is [phone]

## 2019-05-07 ENCOUNTER — Encounter: Payer: Self-pay | Admitting: Physician Assistant

## 2019-05-07 ENCOUNTER — Other Ambulatory Visit: Payer: Self-pay

## 2019-05-07 ENCOUNTER — Ambulatory Visit (INDEPENDENT_AMBULATORY_CARE_PROVIDER_SITE_OTHER): Payer: 59 | Admitting: Physician Assistant

## 2019-05-07 DIAGNOSIS — Z96642 Presence of left artificial hip joint: Secondary | ICD-10-CM

## 2019-05-07 MED ORDER — HYDROCODONE-ACETAMINOPHEN 5-325 MG PO TABS: 1.0000 | ORAL_TABLET | ORAL | 0 refills | Status: AC | PRN

## 2019-05-07 MED ORDER — HYDROCODONE-ACETAMINOPHEN 5-325 MG PO TABS: 1.0000 | ORAL_TABLET | ORAL | 0 refills | Status: DC | PRN

## 2019-05-07 NOTE — Telephone Encounter (Signed)
LMOM for patient that I was returning her call 

## 2019-05-07 NOTE — Progress Notes (Signed)
HPI: Daisy Ortiz returns 2-week status post left total hip arthroplasty.  She is overall doing well.  She does note that the oxycodone makes her sick.  She is needing something for pain at night and in the morning.  Physical exam: Left hip surgical incisions well approximated with Steri-Strips.  No signs of infection.  She did have a blister just proximal to the incision from adhesive.  But no rash.  Left calf supple nontender.  Dorsiflexion plantarflexion ankle intact.  She ambulates with a slight antalgic gait without any assistive device.  Impression: 2-week status post left total hip arthroplasty  Plan: She will work on range of motion strengthening left hip.  Follow-up with [place] in 4 weeks sooner if there is any questions concerns.  Norco was sent in for her today.

## 2019-06-04 ENCOUNTER — Encounter: Payer: Self-pay | Admitting: Orthopaedic Surgery

## 2019-07-18 ENCOUNTER — Encounter: Payer: Self-pay | Admitting: Orthopaedic Surgery

## 2019-07-18 ENCOUNTER — Ambulatory Visit (INDEPENDENT_AMBULATORY_CARE_PROVIDER_SITE_OTHER): Payer: 59 | Admitting: Orthopaedic Surgery

## 2019-07-18 ENCOUNTER — Other Ambulatory Visit: Payer: Self-pay

## 2019-07-18 DIAGNOSIS — Z96641 Presence of right artificial hip joint: Secondary | ICD-10-CM

## 2019-07-18 DIAGNOSIS — Z96642 Presence of left artificial hip joint: Secondary | ICD-10-CM

## 2019-07-18 NOTE — Progress Notes (Signed)
The patient is now 3 months status post a left total hip arthroplasty in 5 months status post a right total hip arthroplasty.  This was done secondary to avascular necrosis.  She has been on steroids off and on for years secondary to Crohn's disease.  She is doing well overall.  She is back to regular work duties.  She does report there is some slight trochanteric pain on the left side which is the more recent surgery.  She is walking without an assistive device.  She is only 24 years old.  On exam her leg lengths are equal.  She has a normal gait.  She has normal range of motion of both hips.  At this point she will continue increase her activities as comfort allows with no restrictions.  All question concerns were answered and addressed.  I would like to see her back in 6 months.  At that visit we will have a standing low AP pelvis and lateral of both hips.

## 2019-08-26 ENCOUNTER — Encounter: Payer: Self-pay | Admitting: Orthopaedic Surgery

## 2019-08-28 ENCOUNTER — Encounter: Payer: Self-pay | Admitting: Orthopaedic Surgery

## 2019-08-29 ENCOUNTER — Ambulatory Visit (INDEPENDENT_AMBULATORY_CARE_PROVIDER_SITE_OTHER): Payer: 59 | Admitting: Orthopedic Surgery

## 2019-08-29 ENCOUNTER — Ambulatory Visit (INDEPENDENT_AMBULATORY_CARE_PROVIDER_SITE_OTHER): Payer: 59

## 2019-08-29 ENCOUNTER — Other Ambulatory Visit: Payer: Self-pay

## 2019-08-29 DIAGNOSIS — M25552 Pain in left hip: Secondary | ICD-10-CM

## 2019-08-29 DIAGNOSIS — Z96642 Presence of left artificial hip joint: Secondary | ICD-10-CM | POA: Diagnosis not present

## 2019-08-31 ENCOUNTER — Encounter: Payer: Self-pay | Admitting: Orthopedic Surgery

## 2019-08-31 NOTE — Progress Notes (Signed)
Office Visit Note   Patient: Daisy Ortiz           Date of Birth: 29-Mar-1996           MRN: [id number] Visit Date: 08/29/2019 Requested by: Nyoka Cowden, MD [id number]   [facility] [address] [place],  [place] [address] PCP: Nyoka Cowden, MD  Subjective: Chief Complaint  Patient presents with  . Left Hip - Pain    HPI: Daisy Ortiz is a 24 y.o. female who presents to the office complaining of left hip pain.  Patient is status post left total hip arthroplasty on 04/20/2019 by Dr. Magnus Ivan.  Patient notes that she fell at work on Saturday, falling onto her left lateral thigh.  She works as a Child psychotherapist.  She notes that she initially had a lot of swelling but this is steadily been improving.  She has had increased pain with standing and walking.  She has been taking Tylenol and ibuprofen as needed for pain which is provided some relief.  She is unable to lay on the left side at this point.  She was having some difficulty with left-sided trochanteric bursal pain in February 2021 but patient states that this improved and had resolved prior to her fall.  She does note that pain continues to improve and she denies walking with a limp at any point..                ROS:  All systems reviewed are negative as they relate to the chief complaint within the history of present illness.  Patient denies fevers or chills.  Assessment & Plan: Visit Diagnoses:  1. History of total hip replacement, left   2. Pain in left hip     Plan: Patient is a 24 year old female who presents complaining of left lateral thigh pain.  She has history of bilateral total hip arthroplasty.  Left total hip arthroplasty by Dr. Magnus Ivan was in November 2020 and right total hip arthroplasty was in September 2020.  Patient has overall been doing well until she fell onto her left lateral thigh while working as a Child psychotherapist.  Since the fall, she has not had any significant difficulty with limping and her pain has steadily been improving.   Her gait was observed in the clinic today and she had no evidence of an antalgic limp.  She did have some tenderness to palpation over the left trochanteric bursa and some mild groin pain elicited with Stinchfield exam, internal rotation/external rotation of the hip joint.  Radiographs taken today in the clinic are negative for any periprosthetic fracture or loosening.  No changes were identified compared with previous radiographs postoperatively.  Patient's pain is continuing to improve.  No significant concern at this time but recommended that patient return to Dr. Magnus Ivan if her pain does not resolve in the next several weeks.  She agreed with this plan.  Follow-Up Instructions: No follow-ups on file.   Orders:  Orders Placed This Encounter  Procedures  . XR HIP UNILAT W OR W/O PELVIS 2-3 VIEWS LEFT   No orders of the defined types were placed in this encounter.     Procedures: No procedures performed   Clinical Data: No additional findings.  Objective: Vital Signs: There were no vitals taken for this visit.  Physical Exam:  Constitutional: Patient appears well-developed HEENT:  Head: Normocephalic Eyes:EOM are normal Neck: Normal range of motion Cardiovascular: Normal rate Pulmonary/chest: Effort normal Neurologic: Patient is alert Skin: Skin is warm  Psychiatric: Patient has normal mood and affect  Ortho Exam:  Well-healed left hip incision from total hip arthroplasty without erythema, dehiscence, drainage Mild groin pain elicited with Stinchfield exam and internal rotation/external rotation of the left hip joint.  Mild tenderness to palpation over the left greater trochanter Patient walks without assistant device and has no hint of an antalgic gait.  Specialty Comments:  No specialty comments available.  Imaging: No results found.   PMFS History: Patient Active Problem List   Diagnosis Date Noted  . Acute blood loss as cause of postoperative anemia 04/21/2019      Class: Acute  . Status post total replacement of left hip 04/20/2019  . Avascular necrosis of bone of hip, left ([facility]) 03/19/2019  . Unilateral primary osteoarthritis, right hip 02/02/2019  . Status post total replacement of right hip 02/02/2019   Past Medical History:  Diagnosis Date  . Anemia   . 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 Crohn's disease ([facility])   . GAD (generalized anxiety disorder)   . Major depression   . Pneumonia    as a child  . Right sciatic nerve pain   . Ulcerative colitis ([facility])     No family history on file.  Past Surgical History:  Procedure Laterality Date  . COLONOSCOPY    . ESOPHAGOGASTRODUODENOSCOPY    . TONSILLECTOMY    . TOTAL HIP ARTHROPLASTY Right 02/02/2019   Procedure: RIGHT TOTAL HIP ARTHROPLASTY ANTERIOR APPROACH;  Surgeon: Mcarthur Rossetti, MD;  Location: [facility];  Service: Orthopedics;  Laterality: Right;  . TOTAL HIP ARTHROPLASTY Left 04/20/2019   Procedure: LEFT TOTAL HIP ARTHROPLASTY ANTERIOR APPROACH;  Surgeon: Mcarthur Rossetti, MD;  Location: [facility];  Service: Orthopedics;  Laterality: Left;   Social History   Occupational History  . Not on file  Tobacco Use  . Smoking status: Never Smoker  . Smokeless tobacco: Never Used  Substance and Sexual Activity  . Alcohol use: Yes    Comment: occasional  . Drug use: Never  . Sexual activity: Not Currently

## 2020-01-15 ENCOUNTER — Ambulatory Visit: Payer: 59 | Admitting: Orthopaedic Surgery

## 2020-03-04 ENCOUNTER — Encounter: Payer: Self-pay | Admitting: Orthopaedic Surgery

## 2020-03-04 ENCOUNTER — Ambulatory Visit: Payer: Self-pay

## 2020-03-04 ENCOUNTER — Ambulatory Visit (INDEPENDENT_AMBULATORY_CARE_PROVIDER_SITE_OTHER): Payer: 59 | Admitting: Orthopaedic Surgery

## 2020-03-04 DIAGNOSIS — Z96641 Presence of right artificial hip joint: Secondary | ICD-10-CM

## 2020-03-04 DIAGNOSIS — Z96642 Presence of left artificial hip joint: Secondary | ICD-10-CM | POA: Diagnosis not present

## 2020-03-04 NOTE — Progress Notes (Signed)
Office Visit Note   Patient: Daisy Ortiz           Date of Birth: [birth year]           MRN: [id number] Visit Date: 03/04/2020              Requested by: Nyoka Cowden, MD [id number]   [address] [place],  [place] [address] PCP: Nyoka Cowden, MD   Assessment & Plan: Visit Diagnoses:  1. Status post total replacement of left hip   2. Status post total replacement of right hip     Plan: At this point, follow-up can be as needed for her hips.  If there is any issues at all she knows to let us know that and I talked her about things that would need to bring her back.  All questions and concerns were answered and addressed.  Follow-Up Instructions: Return if symptoms worsen or fail to improve.   Orders:  Orders Placed This Encounter  Procedures  . XR HIPS BILAT W OR W/O PELVIS 3-4 VIEWS   No orders of the defined types were placed in this encounter.     Procedures: No procedures performed   Clinical Data: No additional findings.   Subjective: Chief Complaint  Patient presents with  . Right Hip - Follow-up  . Left Hip - Follow-up  The patient is only 24 years old and is now over a year out from having both her hips replaced.  The right hip was placed in September 2020 in the left hip in November 2020.  This was unfortunately due to significant avascular necrosis having been off and on steroids for many years due to GI issues.  She denies any pain at all.  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  She says her hips are little bit sore when she is been on them all day long but over all reports that she is doing quite well.  She has had no acute change in her medical status.  HPI  Review of Systems She currently denies any headache, chest pain, shortness of breath, fever, chills, nausea, vomiting  Objective: Vital Signs: There were no vitals taken for this visit.  Physical Exam She is alert and orient x3 and in no acute  distress Ortho Exam Examination of both hips shows fluid internal and external rotation with no pain at all or blocks to rotation.  Laying her in a supine position shows that her leg lengths are also equal. Specialty Comments:  No specialty comments available.  Imaging: XR HIPS BILAT W OR W/O PELVIS 3-4 VIEWS  Result Date: 03/04/2020 An AP pelvis and lateral of the right and left hip shows well-seated total hip arthroplasties with no complicating features.    PMFS History: Patient Active Problem List   Diagnosis Date Noted  . Acute blood loss as cause of postoperative anemia 04/21/2019    Class: Acute  . Status post total replacement of left hip 04/20/2019  . Avascular necrosis of bone of hip, left ([facility]) 03/19/2019  . Unilateral primary osteoarthritis, right hip 02/02/2019  . Status post total replacement of right hip 02/02/2019   Past Medical History:  Diagnosis Date  . Anemia   . 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 Crohn's disease ([facility])   . GAD (generalized anxiety disorder)   . Major depression   . Pneumonia    as a child  . Right sciatic  nerve pain   . Ulcerative colitis ([facility])     History reviewed. No pertinent family history.  Past Surgical History:  Procedure Laterality Date  . COLONOSCOPY    . ESOPHAGOGASTRODUODENOSCOPY    . TONSILLECTOMY    . TOTAL HIP ARTHROPLASTY Right 02/02/2019   Procedure: RIGHT TOTAL HIP ARTHROPLASTY ANTERIOR APPROACH;  Surgeon: Kathryne Hitch, MD;  Location: [facility];  Service: Orthopedics;  Laterality: Right;  . TOTAL HIP ARTHROPLASTY Left 04/20/2019   Procedure: LEFT TOTAL HIP ARTHROPLASTY ANTERIOR APPROACH;  Surgeon: Kathryne Hitch, MD;  Location: [facility];  Service: Orthopedics;  Laterality: Left;   Social History   Occupational History  . Not on file  Tobacco Use  . Smoking status: Never Smoker  . Smokeless tobacco: Never Used  Vaping Use  . Vaping Use: Former  Substance and Sexual Activity  . Alcohol use: Yes     Comment: occasional  . Drug use: Never  . Sexual activity: Not Currently

## 2020-05-19 ENCOUNTER — Telehealth: Payer: Self-pay | Admitting: Specialist

## 2020-05-19 NOTE — Telephone Encounter (Signed)
Faxed records 04/06/2019- present

## 2020-05-19 NOTE — Telephone Encounter (Signed)
Daisy Ortiz with [facility] called asking for the pts most recent office notes and and any labs she had done; she has an upcoming infusion and the DR. Would like to review her files first.   Daisy Ortiz fax# [phone] CB# [phone] [phone]

## 2021-06-22 IMAGING — RF DG HIP (WITH PELVIS) OPERATIVE*L*
1 series · 2 of 2 positions shown · non-contrast
Comparison: February 02, 2019.

CLINICAL DATA: Status post left hip replacement.

EXAM:
OPERATIVE left HIP (WITH PELVIS IF PERFORMED) 2 VIEWS
TECHNIQUE: Fluoroscopic spot image(s) were submitted for interpretation
post-operatively.
Radiation exposure index: 1.6256 mGy.

[Series 1: unknown protocol · 0.20mm/px · 2 of 2 slices shown]
[im 1/2]
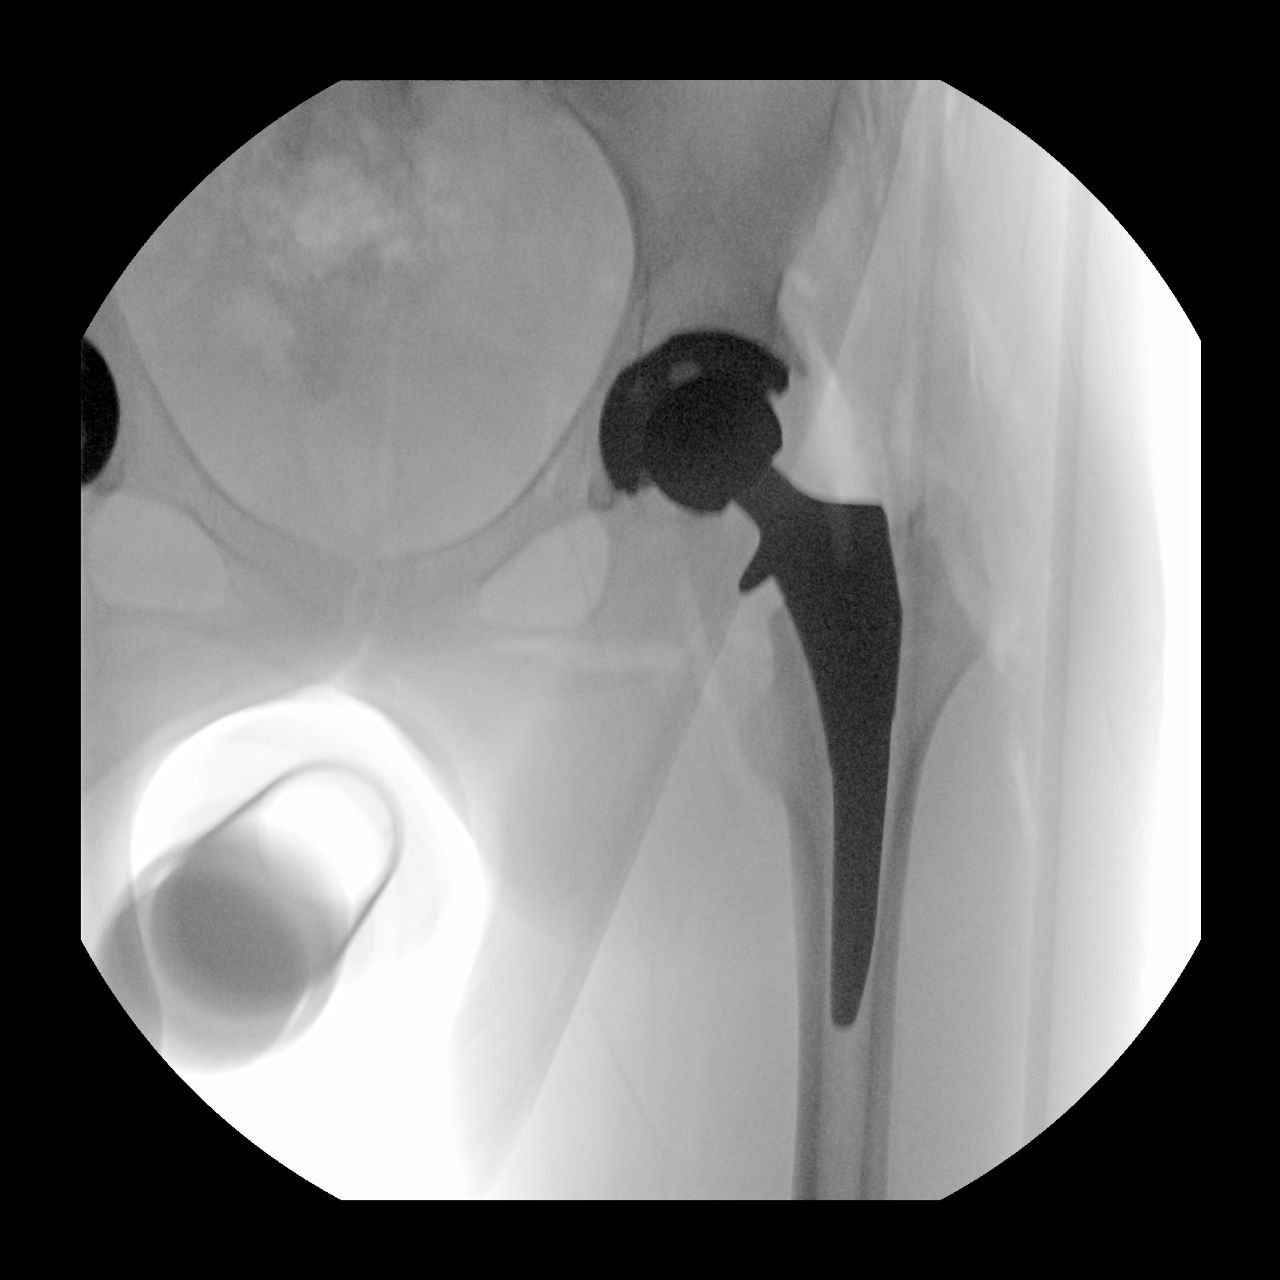
[im 2/2]
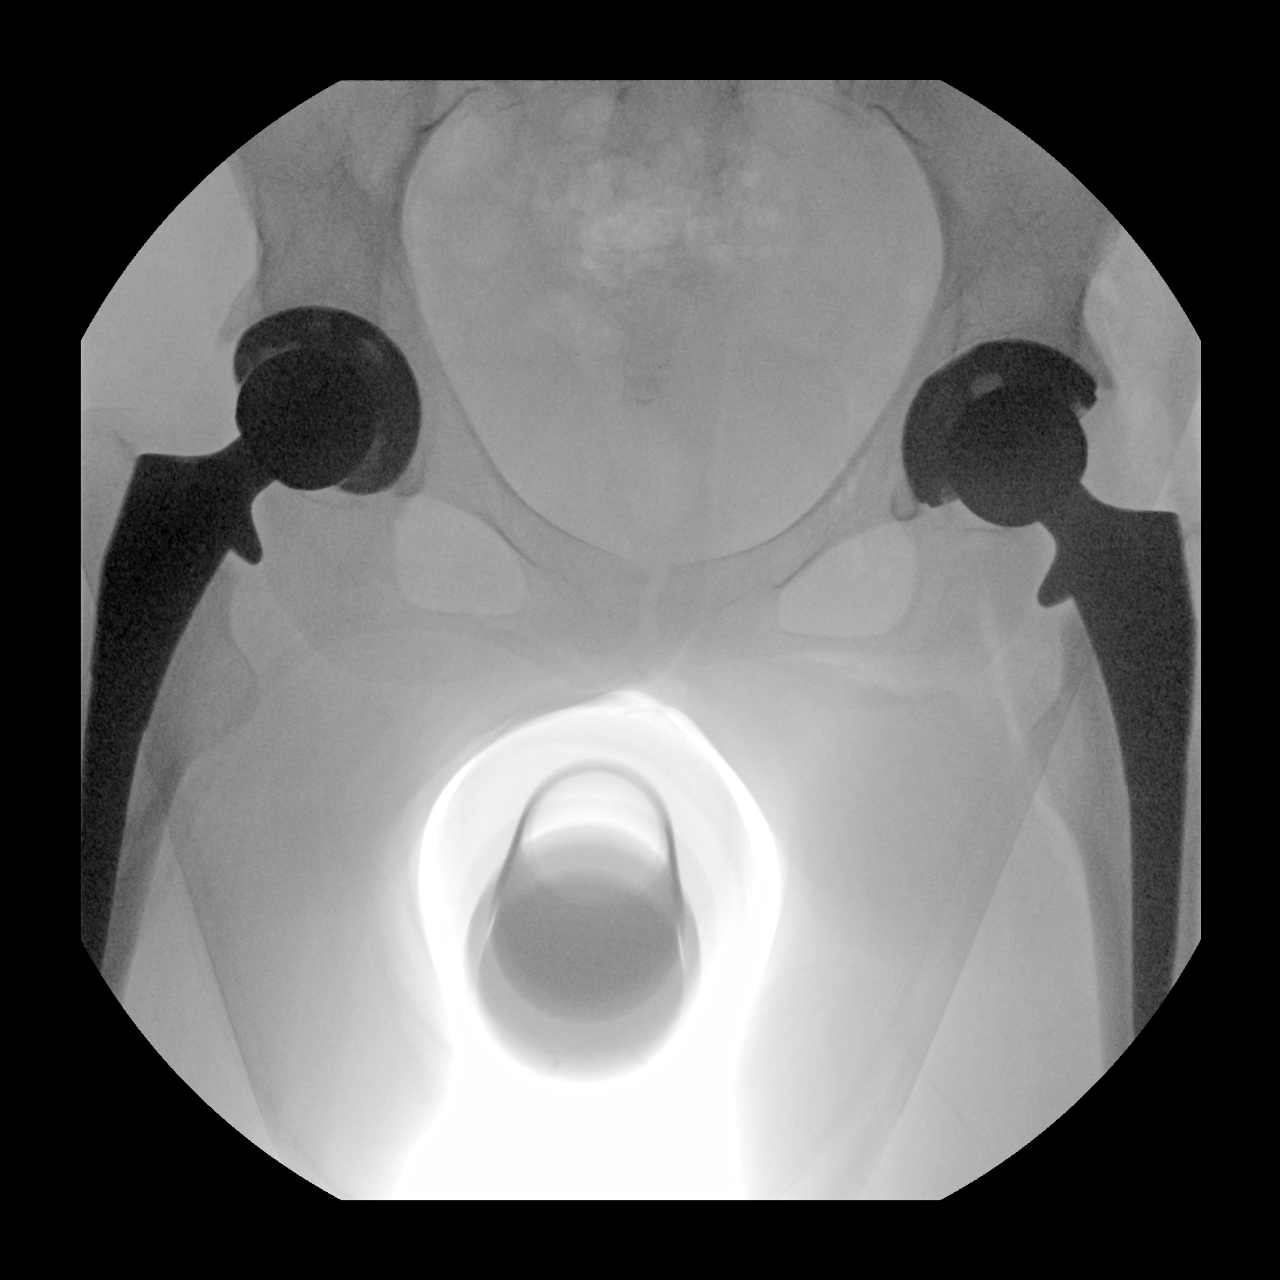

[2 of 2 positions shown; findings below may reference images not displayed]

FINDINGS: Two fluoroscopic images were obtained of the left hip. The left
acetabular and femoral components appear to be well situated.
Expected postoperative changes are seen in the surrounding soft
tissues.
IMPRESSION: Fluoroscopic guidance provided during left total hip arthroplasty.

## 2021-06-22 IMAGING — DX DG PORTABLE PELVIS
2 series · 2 of 2 positions shown · non-contrast
Comparison: Radiograph same day

CLINICAL DATA: Postop left hip

EXAM:
PORTABLE PELVIS 1-2 VIEWS

[pelvis ap (1 of 2)]
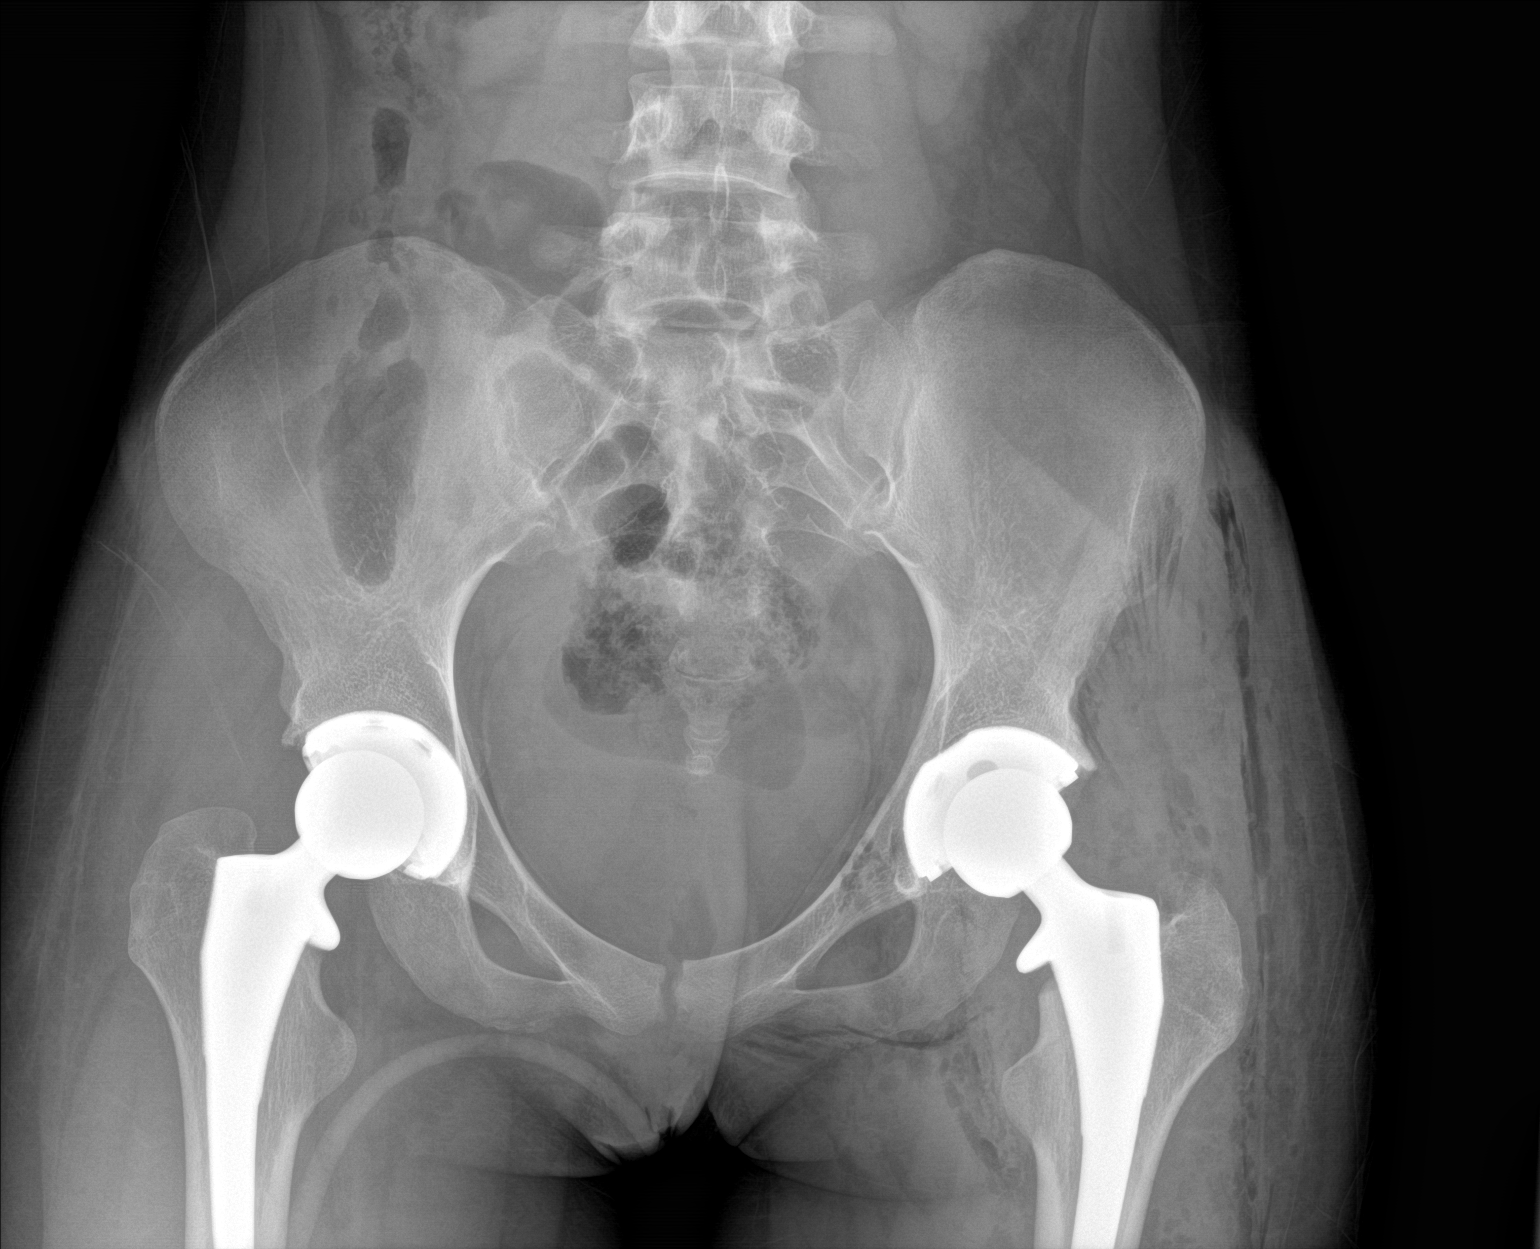

[pelvis ap (2 of 2)]
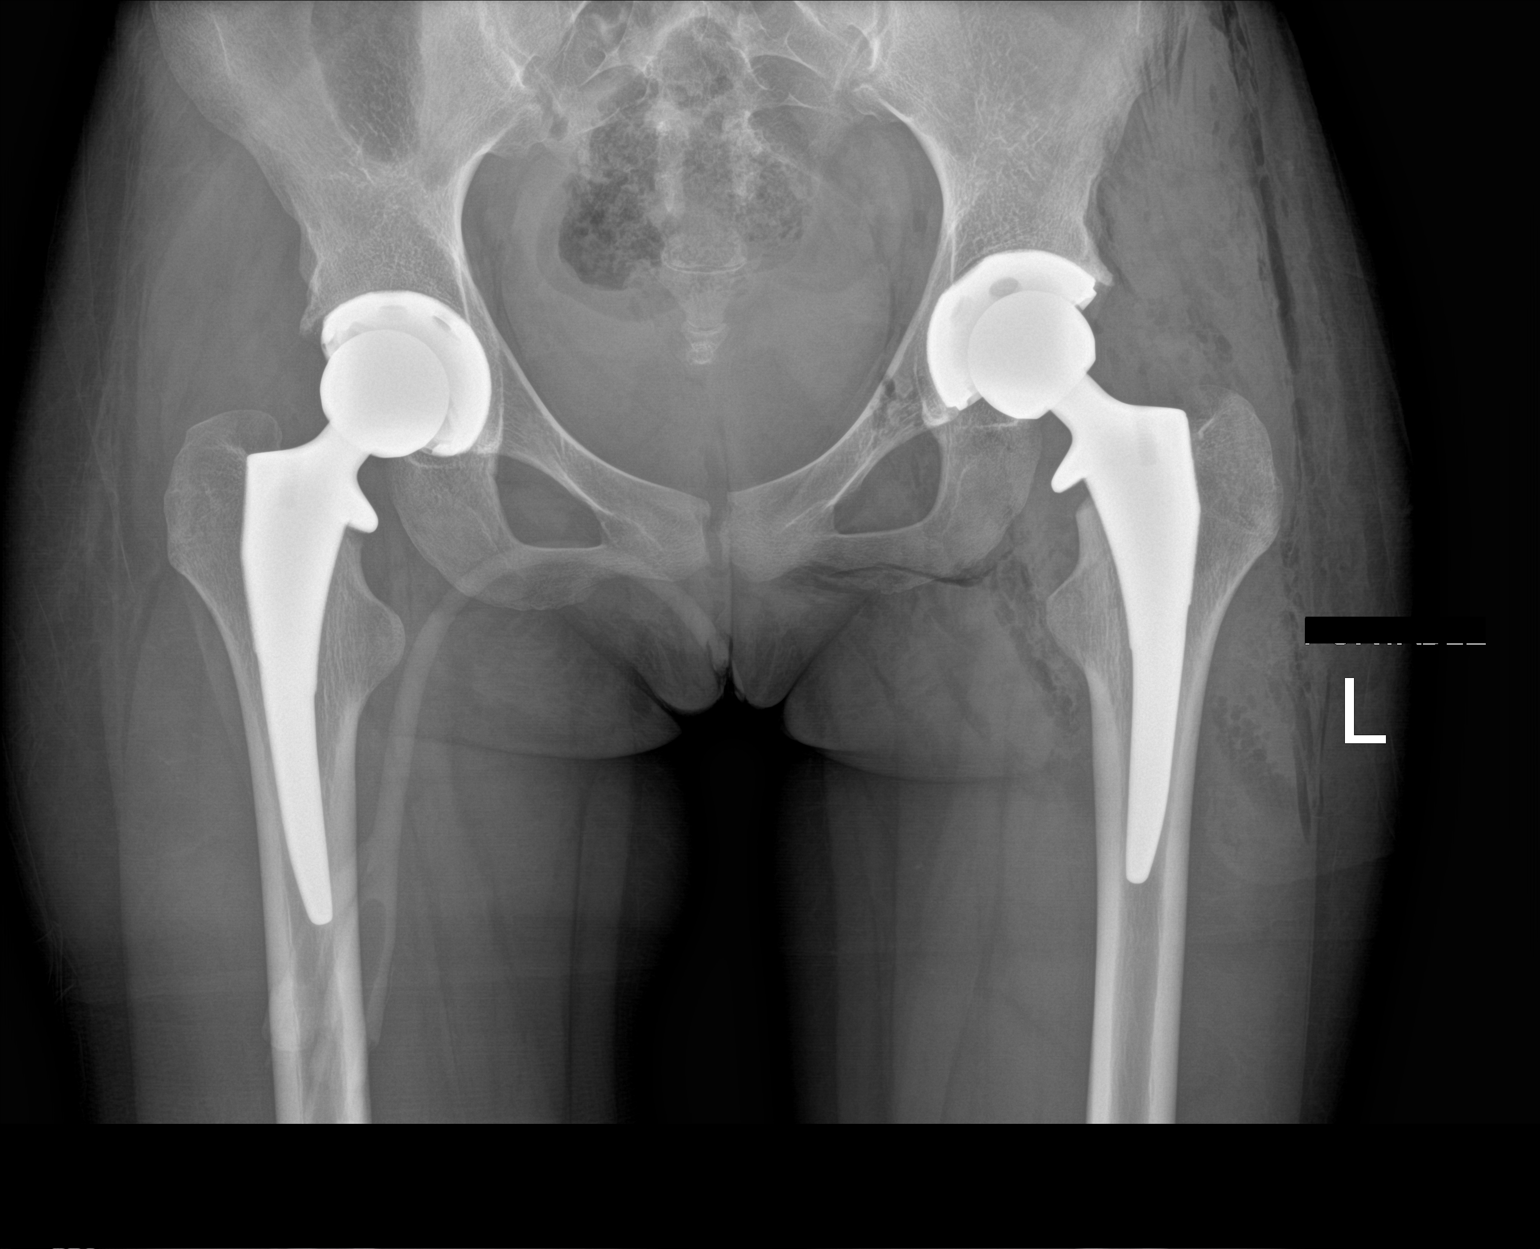

[2 of 2 positions shown; findings below may reference images not displayed]

FINDINGS: The patient is status post left total hip arthroplasty. No
periprosthetic fracture seen. Overlying subcutaneous emphysema and
soft tissue swelling. Again noted is a right-sided total hip
arthroplasty.
IMPRESSION: Status post left total hip arthroplasty without complication.
